# Patient Record
Sex: Female | Born: 1965
Health system: Southern US, Community
[De-identification: ages and names within clinical notes are randomized; demographics above are authoritative.]

## PROBLEM LIST (undated history)

## (undated) DIAGNOSIS — K649 Unspecified hemorrhoids: Secondary | ICD-10-CM

## (undated) DIAGNOSIS — M810 Age-related osteoporosis without current pathological fracture: Secondary | ICD-10-CM

## (undated) DIAGNOSIS — Z8679 Personal history of other diseases of the circulatory system: Secondary | ICD-10-CM

## (undated) HISTORY — PX: DILATION AND CURETTAGE OF UTERUS: SHX78

## (undated) HISTORY — PX: COLONOSCOPY: SHX174

## (undated) HISTORY — PX: CHOLECYSTECTOMY: SHX55

## (undated) HISTORY — PX: OTHER SURGICAL HISTORY: SHX169

## (undated) HISTORY — DX: Unspecified hemorrhoids: K64.9

## (undated) HISTORY — DX: Age-related osteoporosis without current pathological fracture: M81.0

---

## 1998-10-04 ENCOUNTER — Other Ambulatory Visit: Admission: RE | Admit: 1998-10-04 | Discharge: 1998-10-04 | Payer: Self-pay | Admitting: Obstetrics and Gynecology

## 1999-10-29 ENCOUNTER — Other Ambulatory Visit: Admission: RE | Admit: 1999-10-29 | Discharge: 1999-10-29 | Payer: Self-pay | Admitting: Obstetrics and Gynecology

## 2000-12-24 ENCOUNTER — Other Ambulatory Visit: Admission: RE | Admit: 2000-12-24 | Discharge: 2000-12-24 | Payer: Self-pay | Admitting: Obstetrics and Gynecology

## 2002-01-19 ENCOUNTER — Other Ambulatory Visit: Admission: RE | Admit: 2002-01-19 | Discharge: 2002-01-19 | Payer: Self-pay | Admitting: Obstetrics and Gynecology

## 2003-01-22 ENCOUNTER — Other Ambulatory Visit: Admission: RE | Admit: 2003-01-22 | Discharge: 2003-01-22 | Payer: Self-pay | Admitting: Obstetrics and Gynecology

## 2004-02-04 ENCOUNTER — Other Ambulatory Visit: Admission: RE | Admit: 2004-02-04 | Discharge: 2004-02-04 | Payer: Self-pay | Admitting: Obstetrics and Gynecology

## 2004-08-07 ENCOUNTER — Other Ambulatory Visit: Admission: RE | Admit: 2004-08-07 | Discharge: 2004-08-07 | Payer: Self-pay | Admitting: Obstetrics and Gynecology

## 2005-02-11 ENCOUNTER — Other Ambulatory Visit: Admission: RE | Admit: 2005-02-11 | Discharge: 2005-02-11 | Payer: Self-pay | Admitting: Obstetrics and Gynecology

## 2006-09-08 ENCOUNTER — Encounter: Admission: RE | Admit: 2006-09-08 | Discharge: 2006-09-08 | Payer: Self-pay | Admitting: Obstetrics and Gynecology

## 2007-10-04 ENCOUNTER — Encounter: Admission: RE | Admit: 2007-10-04 | Discharge: 2007-10-04 | Payer: Self-pay | Admitting: Obstetrics and Gynecology

## 2008-11-15 ENCOUNTER — Encounter: Admission: RE | Admit: 2008-11-15 | Discharge: 2008-11-15 | Payer: Self-pay | Admitting: Obstetrics and Gynecology

## 2009-10-26 HISTORY — PX: CHOLECYSTECTOMY: SHX55

## 2012-11-14 ENCOUNTER — Telehealth: Payer: Self-pay | Admitting: Internal Medicine

## 2012-11-14 NOTE — Telephone Encounter (Signed)
Received 2 pages from [facility], sent to Dr. Marina Goodell. 11/14/12/sd

## 2012-11-16 ENCOUNTER — Telehealth: Payer: Self-pay | Admitting: Internal Medicine

## 2012-11-16 NOTE — Telephone Encounter (Signed)
Forward 2 pages form [facility] Dr. Yancey Flemings for review on 11-16-12 ym

## 2014-10-15 ENCOUNTER — Telehealth: Payer: Self-pay | Admitting: *Deleted

## 2014-10-15 NOTE — Telephone Encounter (Signed)
Pt would love for you to call her. She's interested in your class...thanks

## 2015-03-18 NOTE — H&P (Signed)
Cindy Blanchard  DICTATION # [id number] CSN# [id number]   Meriel PicaHOLLAND,Milani Lowenstein M, MD 03/18/2015 2:05 PM

## 2015-03-19 NOTE — H&P (Signed)
NAMParke Simmers:  Cindy Blanchard, Cindy Blanchard              ACCOUNT NO.:  [id number]  MEDICAL RECORD NO.:  [id number]  LOCATION:  [facility]IO                         FACILITY:  [facility]  PHYSICIAN:  Duke Salviaichard M. Marcelle OverlieHolland, M.D.DATE OF BIRTH:  01-09-1966  DATE OF ADMISSION:  01/11/2015 DATE OF DISCHARGE:                             HISTORY & PHYSICAL   CHIEF COMPLAINT:  Stress urinary incontinence, symptomatic uterine prolapse.  HISTORY OF PRESENT ILLNESS:  A 49 year old, G2, P2.  Her husband has had a vasectomy.  She has been bothered for several years with increased problems related to pelvic pressure with uterine prolapse, cystocele, rectocele and complaints of stress urinary incontinence.  Recently underwent urodynamic studies on our office that showed a normal LPP and MUCP.  She did leak with a full bladder especially with standing and tends to have more leaking with sneezing, laughing and exercise.  She presents at this time for LAVH, bilateral salpingectomy, A and P repair along with midurethral sling and cystoscopy.  Also sacrospinous ligament fixation to try and deter future cuff prolapse.  This procedure including specific risk related to bleeding, infection, transfusion, adjacent organ injury, the possible need to complete the surgery by open technique, all discussed along with her expected recovery time.  Issues related to concerns about polypropylene mesh in regard to midurethral sling discussed.  The risk of infection or mesh exposure and how to treat this have been reviewed with her.  PAST MEDICAL HISTORY:  ALLERGIES:  None.  CURRENT MEDICATIONS:  None.  PAST SURGICAL HISTORY:  She has had two prior vaginal deliveries, cholecystectomy in 2011.  REVIEW OF SYSTEMS:  Otherwise unremarkable except for pelvic pain, menorrhagia and prolapse symptoms.  SOCIAL HISTORY:  Denies tobacco or drug use.  She does drink alcohol socially.  She is married.  PHYSICAL EXAMINATION:  VITAL SIGNS:  Temperature 98.2,  blood pressure 110/78. HEENT:  Unremarkable. NECK:  Supple without masses. LUNGS:  Clear. CARDIOVASCULAR:  Regular rate and rhythm without murmurs, rubs, or gallops. BREASTS:  Without masses. ABDOMEN:  Soft, flat, nontender.  Vulva, vagina and cervix revealed moderate uterine prolapse with moderate cystocele and rectocele. Bimanual otherwise negative. EXTREMITIES:  Unremarkable. NEUROLOGIC:  Unremarkable.  IMPRESSION: 1. Stress urinary incontinence. 2. History of menorrhagia, possible adenomyosis on recent ultrasound. 3. Symptomatic uterine prolapse, cystocele and rectocele.  PLAN:  LAVH, bilateral salpingectomy, conservation of ovaries if normal, anterior and posterior colporrhaphy, midurethral sling with cystoscopy, sacrospinous ligament fixation for cuff support.  Procedure and risks were discussed as above.     Roddy Bellamy M. Marcelle OverlieHolland, M.D.     [facility]/MEDQ  D:  03/18/2015  T:  03/19/2015  Job:  [id number]

## 2015-03-20 ENCOUNTER — Encounter (HOSPITAL_COMMUNITY): Payer: Self-pay

## 2015-03-20 ENCOUNTER — Encounter (HOSPITAL_COMMUNITY)
Admission: RE | Admit: 2015-03-20 | Discharge: 2015-03-20 | Disposition: A | Discharge status: Home / Self Care | Payer: BLUE CROSS/BLUE SHIELD | Source: Ambulatory Visit | Attending: Obstetrics and Gynecology | Admitting: Obstetrics and Gynecology

## 2015-03-20 DIAGNOSIS — Z01818 Encounter for other preprocedural examination: Secondary | ICD-10-CM | POA: Insufficient documentation

## 2015-03-20 HISTORY — DX: Personal history of other diseases of the circulatory system: Z86.79

## 2015-03-20 LAB — TYPE AND SCREEN
ABO/RH(D): O POS
Antibody Screen: NEGATIVE

## 2015-03-20 LAB — CBC
HEMATOCRIT: 35.3 % — AB (ref 36.0–46.0)
HEMOGLOBIN: 11.6 g/dL — AB (ref 12.0–15.0)
MCH: 28.5 pg (ref 26.0–34.0)
MCHC: 32.9 g/dL (ref 30.0–36.0)
MCV: 86.7 fL (ref 78.0–100.0)
PLATELETS: 253 10*3/uL (ref 150–400)
RBC: 4.07 MIL/uL (ref 3.87–5.11)
RDW: 13.6 % (ref 11.5–15.5)
WBC: 6.2 10*3/uL (ref 4.0–10.5)

## 2015-03-20 LAB — ABO/RH: ABO/RH(D): O POS

## 2015-03-20 NOTE — Patient Instructions (Addendum)
Your procedure is scheduled on:  June 2,2016  Enter through the Main [facility]trance of [facility] at:  0600  Pick up the phone at the desk and dial [phone].  Call this number if you have problems the morning of surgery: [phone].  Remember: Do NOT eat food:  After midnight  Do NOT drink clear liquids after:  Midnight  Take these medicines the morning of surgery with a SIP OF WATER:  None   Do NOT wear jewelry (body piercing), metal hair clips/bobby pins, make-up, or nail polish.  Do NOT wear lotions, powders, or perfumes.  You may wear deoderant. Do NOT shave for 48 hours prior to surgery. Do NOT bring valuables to the hospital. Contacts, dentures, or bridgework may not be worn into surgery. Leave suitcase in car.  After surgery it may be brought to your room.  For patients admitted to the hospital, checkout time is 11:00 AM the day of discharge.

## 2015-03-27 MED ORDER — DEXTROSE 5 % IV SOLN
2.0000 g | INTRAVENOUS | Status: AC
  Administered 2015-03-28: 2 g via INTRAVENOUS
  Filled 2015-03-27: qty 2

## 2015-03-27 NOTE — Anesthesia Preprocedure Evaluation (Addendum)
Anesthesia Evaluation  Patient identified by MRN, date of birth, ID band Patient awake    Reviewed: Allergy & Precautions, NPO status , Patient's Chart, lab work & pertinent test results  History of Anesthesia Complications Negative for: history of anesthetic complications  Airway Mallampati: II  TM Distance: >3 FB Neck ROM: Full    Dental no notable dental hx. (+) Dental Advisory Given   Pulmonary neg pulmonary ROS,  breath sounds clear to auscultation  Pulmonary exam normal       Cardiovascular Exercise Tolerance: Good Normal cardiovascular exam+ dysrhythmias Supra Ventricular Tachycardia Rhythm:Regular Rate:Normal     Neuro/Psych negative neurological ROS  negative psych ROS   GI/Hepatic negative GI ROS, Neg liver ROS,   Endo/Other  negative endocrine ROS  Renal/GU negative Renal ROS  negative genitourinary   Musculoskeletal negative musculoskeletal ROS (+)   Abdominal   Peds negative pediatric ROS (+)  Hematology negative hematology ROS (+)   Anesthesia Other Findings   Reproductive/Obstetrics negative OB ROS                             Anesthesia Physical Anesthesia Plan  ASA: II  Anesthesia Plan: General   Post-op Pain Management:    Induction: Intravenous  Airway Management Planned: Oral ETT  Additional Equipment:   Intra-op Plan:   Post-operative Plan: Extubation in OR  Informed Consent: I have reviewed the patients History and Physical, chart, labs and discussed the procedure including the risks, benefits and alternatives for the proposed anesthesia with the patient or authorized representative who has indicated his/her understanding and acceptance.   Dental advisory given  Plan Discussed with: CRNA  Anesthesia Plan Comments:         Anesthesia Quick Evaluation

## 2015-03-28 ENCOUNTER — Ambulatory Visit (HOSPITAL_COMMUNITY): Payer: BLUE CROSS/BLUE SHIELD | Admitting: Anesthesiology

## 2015-03-28 ENCOUNTER — Encounter (HOSPITAL_COMMUNITY): Payer: Self-pay | Admitting: Anesthesiology

## 2015-03-28 ENCOUNTER — Encounter (HOSPITAL_COMMUNITY)
Admission: RE | Disposition: A | Discharge status: Home / Self Care | Payer: Self-pay | Source: Ambulatory Visit | Attending: Obstetrics and Gynecology

## 2015-03-28 ENCOUNTER — Inpatient Hospital Stay (HOSPITAL_COMMUNITY)
Admission: RE | Admit: 2015-03-28 | Discharge: 2015-03-29 | DRG: 743 | Disposition: A | Discharge status: Home / Self Care | Payer: BLUE CROSS/BLUE SHIELD | Source: Ambulatory Visit | Attending: Obstetrics and Gynecology | Admitting: Obstetrics and Gynecology

## 2015-03-28 DIAGNOSIS — N8003 Adenomyosis of the uterus: Secondary | ICD-10-CM | POA: Diagnosis present

## 2015-03-28 DIAGNOSIS — Z9049 Acquired absence of other specified parts of digestive tract: Secondary | ICD-10-CM | POA: Diagnosis present

## 2015-03-28 DIAGNOSIS — N920 Excessive and frequent menstruation with regular cycle: Secondary | ICD-10-CM | POA: Diagnosis present

## 2015-03-28 DIAGNOSIS — N814 Uterovaginal prolapse, unspecified: Secondary | ICD-10-CM | POA: Diagnosis present

## 2015-03-28 DIAGNOSIS — N393 Stress incontinence (female) (male): Secondary | ICD-10-CM | POA: Diagnosis present

## 2015-03-28 DIAGNOSIS — N800 Endometriosis of uterus: Secondary | ICD-10-CM | POA: Diagnosis present

## 2015-03-28 DIAGNOSIS — N809 Endometriosis, unspecified: Secondary | ICD-10-CM

## 2015-03-28 HISTORY — PX: ANTERIOR AND POSTERIOR REPAIR WITH SACROSPINOUS FIXATION: SHX6536

## 2015-03-28 HISTORY — PX: PUBOVAGINAL SLING: SHX1035

## 2015-03-28 HISTORY — PX: BILATERAL SALPINGECTOMY: SHX5743

## 2015-03-28 HISTORY — PX: LAPAROSCOPIC ASSISTED VAGINAL HYSTERECTOMY: SHX5398

## 2015-03-28 SURGERY — HYSTERECTOMY, VAGINAL, LAPAROSCOPY-ASSISTED
Anesthesia: General

## 2015-03-28 MED ORDER — ESTRADIOL 0.1 MG/GM VA CREA
TOPICAL_CREAM | VAGINAL | Status: AC
  Filled 2015-03-28: qty 42.5

## 2015-03-28 MED ORDER — KETOROLAC TROMETHAMINE 30 MG/ML IJ SOLN
INTRAMUSCULAR | Status: DC | PRN
  Administered 2015-03-28: 30 mg via INTRAVENOUS

## 2015-03-28 MED ORDER — EPHEDRINE SULFATE 50 MG/ML IJ SOLN
INTRAMUSCULAR | Status: DC | PRN
  Administered 2015-03-28: 15 mg via INTRAVENOUS
  Administered 2015-03-28 (×2): 5 mg via INTRAVENOUS
  Administered 2015-03-28: 15 mg via INTRAVENOUS

## 2015-03-28 MED ORDER — DEXAMETHASONE SODIUM PHOSPHATE 10 MG/ML IJ SOLN
INTRAMUSCULAR | Status: DC | PRN
  Administered 2015-03-28: 4 mg via INTRAVENOUS

## 2015-03-28 MED ORDER — FLUORESCEIN SODIUM 10 % IJ SOLN
INTRAMUSCULAR | Status: DC | PRN
  Administered 2015-03-28: 100 mg via INTRAVENOUS

## 2015-03-28 MED ORDER — STERILE WATER FOR IRRIGATION IR SOLN
Status: DC | PRN
  Administered 2015-03-28: 1000 mL via INTRAVESICAL

## 2015-03-28 MED ORDER — SODIUM CHLORIDE 0.9 % IJ SOLN: 9.0000 mL | INTRAMUSCULAR | Status: DC | PRN

## 2015-03-28 MED ORDER — NALOXONE HCL 0.4 MG/ML IJ SOLN: 0.4000 mg | INTRAMUSCULAR | Status: DC | PRN

## 2015-03-28 MED ORDER — 0.9 % SODIUM CHLORIDE (POUR BTL) OPTIME
TOPICAL | Status: DC | PRN
  Administered 2015-03-28: 1000 mL

## 2015-03-28 MED ORDER — FENTANYL CITRATE (PF) 250 MCG/5ML IJ SOLN
INTRAMUSCULAR | Status: AC
  Filled 2015-03-28: qty 5

## 2015-03-28 MED ORDER — SCOPOLAMINE 1 MG/3DAYS TD PT72
1.0000 | MEDICATED_PATCH | Freq: Once | TRANSDERMAL | Status: DC
  Administered 2015-03-28: 1.5 mg via TRANSDERMAL

## 2015-03-28 MED ORDER — MORPHINE SULFATE (PF) 1 MG/ML IV SOLN
INTRAVENOUS | Status: DC
  Administered 2015-03-28: 2 mg via INTRAVENOUS
  Administered 2015-03-28: 11:00:00 via INTRAVENOUS
  Filled 2015-03-28: qty 25

## 2015-03-28 MED ORDER — ESTRADIOL 0.1 MG/GM VA CREA
TOPICAL_CREAM | VAGINAL | Status: DC | PRN
  Administered 2015-03-28: 1 via VAGINAL

## 2015-03-28 MED ORDER — ONDANSETRON HCL 4 MG/2ML IJ SOLN
INTRAMUSCULAR | Status: DC | PRN
  Administered 2015-03-28: 4 mg via INTRAVENOUS

## 2015-03-28 MED ORDER — NEOSTIGMINE METHYLSULFATE 10 MG/10ML IV SOLN
INTRAVENOUS | Status: AC
  Filled 2015-03-28: qty 1

## 2015-03-28 MED ORDER — KETOROLAC TROMETHAMINE 30 MG/ML IJ SOLN
30.0000 mg | Freq: Once | INTRAMUSCULAR | Status: DC
Start: 2015-03-28 — End: 2015-03-28

## 2015-03-28 MED ORDER — FLUORESCEIN SODIUM 10 % IJ SOLN
INTRAMUSCULAR | Status: AC
  Filled 2015-03-28: qty 5

## 2015-03-28 MED ORDER — LIDOCAINE HCL (CARDIAC) 20 MG/ML IV SOLN
INTRAVENOUS | Status: AC
  Filled 2015-03-28: qty 5

## 2015-03-28 MED ORDER — ONDANSETRON HCL 4 MG/2ML IJ SOLN
INTRAMUSCULAR | Status: AC
  Filled 2015-03-28: qty 2

## 2015-03-28 MED ORDER — ONDANSETRON HCL 4 MG PO TABS: 4.0000 mg | ORAL_TABLET | Freq: Four times a day (QID) | ORAL | Status: DC | PRN

## 2015-03-28 MED ORDER — FLUMAZENIL 0.5 MG/5ML IV SOLN
INTRAVENOUS | Status: DC | PRN
  Administered 2015-03-28: 0.2 mg via INTRAVENOUS

## 2015-03-28 MED ORDER — LIDOCAINE-EPINEPHRINE 1 %-1:100000 IJ SOLN
INTRAMUSCULAR | Status: DC | PRN
  Administered 2015-03-28: 7 mL

## 2015-03-28 MED ORDER — ARTIFICIAL TEARS OP OINT
TOPICAL_OINTMENT | OPHTHALMIC | Status: AC
  Filled 2015-03-28: qty 3.5

## 2015-03-28 MED ORDER — DIPHENHYDRAMINE HCL 12.5 MG/5ML PO ELIX: 12.5000 mg | ORAL_SOLUTION | Freq: Four times a day (QID) | ORAL | Status: DC | PRN

## 2015-03-28 MED ORDER — MIDAZOLAM HCL 2 MG/2ML IJ SOLN
INTRAMUSCULAR | Status: AC
  Filled 2015-03-28: qty 2

## 2015-03-28 MED ORDER — FENTANYL CITRATE (PF) 100 MCG/2ML IJ SOLN
INTRAMUSCULAR | Status: AC
Start: 2015-03-28 — End: 2015-03-28
  Filled 2015-03-28: qty 2

## 2015-03-28 MED ORDER — ONDANSETRON HCL 4 MG/2ML IJ SOLN: 4.0000 mg | Freq: Four times a day (QID) | INTRAMUSCULAR | Status: DC | PRN

## 2015-03-28 MED ORDER — EPHEDRINE 5 MG/ML INJ
INTRAVENOUS | Status: AC
  Filled 2015-03-28: qty 10

## 2015-03-28 MED ORDER — GLYCOPYRROLATE 0.2 MG/ML IJ SOLN
INTRAMUSCULAR | Status: DC | PRN
  Administered 2015-03-28: 0.6 mg via INTRAVENOUS

## 2015-03-28 MED ORDER — METHYLENE BLUE 1 % INJ SOLN
INTRAMUSCULAR | Status: AC
  Filled 2015-03-28: qty 1

## 2015-03-28 MED ORDER — KETOROLAC TROMETHAMINE 30 MG/ML IJ SOLN: 30.0000 mg | Freq: Four times a day (QID) | INTRAMUSCULAR | Status: DC

## 2015-03-28 MED ORDER — FENTANYL CITRATE (PF) 100 MCG/2ML IJ SOLN
25.0000 ug | INTRAMUSCULAR | Status: DC | PRN
  Administered 2015-03-28 (×2): 50 ug via INTRAVENOUS

## 2015-03-28 MED ORDER — GLYCOPYRROLATE 0.2 MG/ML IJ SOLN
INTRAMUSCULAR | Status: AC
  Filled 2015-03-28: qty 3

## 2015-03-28 MED ORDER — OXYCODONE-ACETAMINOPHEN 5-325 MG PO TABS: 1.0000 | ORAL_TABLET | ORAL | Status: DC | PRN

## 2015-03-28 MED ORDER — ROCURONIUM BROMIDE 100 MG/10ML IV SOLN
INTRAVENOUS | Status: AC
  Filled 2015-03-28: qty 1

## 2015-03-28 MED ORDER — MENTHOL 3 MG MT LOZG: 1.0000 | LOZENGE | OROMUCOSAL | Status: DC | PRN

## 2015-03-28 MED ORDER — BUPIVACAINE HCL (PF) 0.25 % IJ SOLN
INTRAMUSCULAR | Status: AC
Start: 2015-03-28 — End: 2015-03-28
  Filled 2015-03-28: qty 30

## 2015-03-28 MED ORDER — FENTANYL CITRATE (PF) 100 MCG/2ML IJ SOLN
INTRAMUSCULAR | Status: DC | PRN
  Administered 2015-03-28 (×5): 50 ug via INTRAVENOUS

## 2015-03-28 MED ORDER — ACETAMINOPHEN 10 MG/ML IV SOLN
1000.0000 mg | Freq: Once | INTRAVENOUS | Status: AC
  Administered 2015-03-28: 1000 mg via INTRAVENOUS
  Filled 2015-03-28: qty 100

## 2015-03-28 MED ORDER — ROCURONIUM BROMIDE 100 MG/10ML IV SOLN
INTRAVENOUS | Status: DC | PRN
  Administered 2015-03-28: 50 mg via INTRAVENOUS

## 2015-03-28 MED ORDER — SCOPOLAMINE 1 MG/3DAYS TD PT72
MEDICATED_PATCH | TRANSDERMAL | Status: DC
Start: 2015-03-28 — End: 2015-03-29
  Administered 2015-03-28: 1.5 mg via TRANSDERMAL
  Filled 2015-03-28: qty 1

## 2015-03-28 MED ORDER — KETOROLAC TROMETHAMINE 30 MG/ML IJ SOLN
INTRAMUSCULAR | Status: AC
  Filled 2015-03-28: qty 1

## 2015-03-28 MED ORDER — NEOSTIGMINE METHYLSULFATE 10 MG/10ML IV SOLN
INTRAVENOUS | Status: DC | PRN
  Administered 2015-03-28: 4 mg via INTRAVENOUS

## 2015-03-28 MED ORDER — LACTATED RINGERS IV SOLN
INTRAVENOUS | Status: DC
  Administered 2015-03-28 (×3): via INTRAVENOUS

## 2015-03-28 MED ORDER — LIDOCAINE-EPINEPHRINE (PF) 2 %-1:200000 IJ SOLN
INTRAMUSCULAR | Status: AC
  Filled 2015-03-28: qty 20

## 2015-03-28 MED ORDER — BUTORPHANOL TARTRATE 1 MG/ML IJ SOLN: 1.0000 mg | INTRAMUSCULAR | Status: DC | PRN

## 2015-03-28 MED ORDER — KETOROLAC TROMETHAMINE 30 MG/ML IJ SOLN
30.0000 mg | Freq: Four times a day (QID) | INTRAMUSCULAR | Status: DC
  Administered 2015-03-28: 30 mg via INTRAVENOUS
  Filled 2015-03-28 (×2): qty 1

## 2015-03-28 MED ORDER — LIDOCAINE HCL (CARDIAC) 20 MG/ML IV SOLN
INTRAVENOUS | Status: DC | PRN
  Administered 2015-03-28: 30 mg via INTRAVENOUS
  Administered 2015-03-28: 70 mg via INTRAVENOUS

## 2015-03-28 MED ORDER — LIDOCAINE-EPINEPHRINE 1 %-1:100000 IJ SOLN
INTRAMUSCULAR | Status: AC
Start: 2015-03-28 — End: 2015-03-28
  Filled 2015-03-28: qty 1

## 2015-03-28 MED ORDER — PROPOFOL 10 MG/ML IV BOLUS
INTRAVENOUS | Status: DC | PRN
  Administered 2015-03-28: 160 mg via INTRAVENOUS

## 2015-03-28 MED ORDER — PROPOFOL 10 MG/ML IV BOLUS
INTRAVENOUS | Status: AC
  Filled 2015-03-28: qty 20

## 2015-03-28 MED ORDER — DIPHENHYDRAMINE HCL 50 MG/ML IJ SOLN: 12.5000 mg | Freq: Four times a day (QID) | INTRAMUSCULAR | Status: DC | PRN

## 2015-03-28 MED ORDER — DEXTROSE IN LACTATED RINGERS 5 % IV SOLN
INTRAVENOUS | Status: DC
  Administered 2015-03-28: 17:00:00 via INTRAVENOUS

## 2015-03-28 MED ORDER — ONDANSETRON HCL 4 MG/2ML IJ SOLN: 4.0000 mg | Freq: Once | INTRAMUSCULAR | Status: DC | PRN

## 2015-03-28 MED ORDER — DEXAMETHASONE SODIUM PHOSPHATE 4 MG/ML IJ SOLN
INTRAMUSCULAR | Status: AC
  Filled 2015-03-28: qty 1

## 2015-03-28 MED ORDER — FLUMAZENIL 0.5 MG/5ML IV SOLN
INTRAVENOUS | Status: AC
  Filled 2015-03-28: qty 5

## 2015-03-28 MED ORDER — MIDAZOLAM HCL 2 MG/2ML IJ SOLN
INTRAMUSCULAR | Status: DC | PRN
  Administered 2015-03-28: 2 mg via INTRAVENOUS

## 2015-03-28 MED ORDER — IBUPROFEN 800 MG PO TABS
800.0000 mg | ORAL_TABLET | Freq: Three times a day (TID) | ORAL | Status: DC | PRN
  Administered 2015-03-29: 800 mg via ORAL
  Filled 2015-03-28: qty 1

## 2015-03-28 MED ORDER — BUPIVACAINE HCL (PF) 0.25 % IJ SOLN
INTRAMUSCULAR | Status: DC | PRN
  Administered 2015-03-28: 12 mL

## 2015-03-28 SURGICAL SUPPLY — 64 items
BLADE SURG 11 STRL SS (BLADE) ×4 IMPLANT
BLADE SURG 15 STRL LF C SS BP (BLADE) IMPLANT
BLADE SURG 15 STRL SS (BLADE)
BNDG GAUZE ELAST 4 BULKY (GAUZE/BANDAGES/DRESSINGS) IMPLANT
CABLE HIGH FREQUENCY MONO STRZ (ELECTRODE) IMPLANT
CANISTER SUCT 3000ML (MISCELLANEOUS) ×4 IMPLANT
CATH ROBINSON RED A/P 16FR (CATHETERS) ×4 IMPLANT
CLOTH BEACON ORANGE TIMEOUT ST (SAFETY) ×4 IMPLANT
CONT PATH 16OZ SNAP LID 3702 (MISCELLANEOUS) ×4 IMPLANT
COVER BACK TABLE 60X90IN (DRAPES) ×4 IMPLANT
DECANTER SPIKE VIAL GLASS SM (MISCELLANEOUS) ×8 IMPLANT
DEVICE CAPIO SLIM SINGLE (INSTRUMENTS) IMPLANT
DRSG COVADERM PLUS 2X2 (GAUZE/BANDAGES/DRESSINGS) ×8 IMPLANT
DRSG OPSITE POSTOP 3X4 (GAUZE/BANDAGES/DRESSINGS) ×4 IMPLANT
DURAPREP 26ML APPLICATOR (WOUND CARE) ×4 IMPLANT
ELECT LIGASURE SHORT 9 REUSE (ELECTRODE) ×4 IMPLANT
ELECT REM PT RETURN 9FT ADLT (ELECTROSURGICAL) ×4
ELECTRODE REM PT RTRN 9FT ADLT (ELECTROSURGICAL) ×2 IMPLANT
GAUZE PACKING 1 X5 YD ST (GAUZE/BANDAGES/DRESSINGS) IMPLANT
GAUZE SPONGE 4X4 16PLY XRAY LF (GAUZE/BANDAGES/DRESSINGS) ×4 IMPLANT
GLOVE BIO SURGEON STRL SZ7 (GLOVE) ×8 IMPLANT
GLOVE BIOGEL PI IND STRL 6.5 (GLOVE) ×2 IMPLANT
GLOVE BIOGEL PI IND STRL 7.0 (GLOVE) ×4 IMPLANT
GLOVE BIOGEL PI INDICATOR 6.5 (GLOVE) ×2
GLOVE BIOGEL PI INDICATOR 7.0 (GLOVE) ×4
GOWN STRL REUS W/TWL LRG LVL3 (GOWN DISPOSABLE) ×16 IMPLANT
LIQUID BAND (GAUZE/BANDAGES/DRESSINGS) ×4 IMPLANT
NEEDLE HYPO 22GX1.5 SAFETY (NEEDLE) ×4 IMPLANT
NEEDLE INSUFFLATION 120MM (ENDOMECHANICALS) ×4 IMPLANT
NEEDLE MAYO 6 CRC TAPER PT (NEEDLE) IMPLANT
NS IRRIG 1000ML POUR BTL (IV SOLUTION) ×4 IMPLANT
PACK LAVH (CUSTOM PROCEDURE TRAY) ×4 IMPLANT
PACK ROBOTIC GOWN (GOWN DISPOSABLE) ×4 IMPLANT
PACK VAGINAL WOMENS (CUSTOM PROCEDURE TRAY) ×4 IMPLANT
PAD POSITIONER PINK NONSTERILE (MISCELLANEOUS) ×4 IMPLANT
PLUG CATH AND CAP STER (CATHETERS) IMPLANT
SEALER TISSUE G2 CVD JAW 45CM (ENDOMECHANICALS) ×4 IMPLANT
SET CYSTO W/LG BORE CLAMP LF (SET/KITS/TRAYS/PACK) ×4 IMPLANT
SET IRRIG TUBING LAPAROSCOPIC (IRRIGATION / IRRIGATOR) IMPLANT
SLING SOLYX SYSTEM SIS EA (Sling) ×4 IMPLANT
SUT CAPIO POLYGLYCOLIC (SUTURE) IMPLANT
SUT MON AB 2-0 CT1 36 (SUTURE) ×12 IMPLANT
SUT MON AB 3-0 SH 27 (SUTURE)
SUT MON AB 3-0 SH27 (SUTURE) IMPLANT
SUT VIC AB 0 CT1 18XCR BRD8 (SUTURE) ×4 IMPLANT
SUT VIC AB 0 CT1 36 (SUTURE) ×4 IMPLANT
SUT VIC AB 0 CT1 8-18 (SUTURE) ×4
SUT VIC AB 2-0 CT1 27 (SUTURE)
SUT VIC AB 2-0 CT1 TAPERPNT 27 (SUTURE) IMPLANT
SUT VIC AB 2-0 CT2 27 (SUTURE) ×12 IMPLANT
SUT VIC AB 2-0 SH 27 (SUTURE)
SUT VIC AB 2-0 SH 27XBRD (SUTURE) IMPLANT
SUT VIC AB 2-0 UR6 27 (SUTURE) ×16 IMPLANT
SUT VICRYL 0 TIES 12 18 (SUTURE) ×4 IMPLANT
SUT VICRYL 4-0 PS2 18IN ABS (SUTURE) ×4 IMPLANT
SUT VICRYL RAPIDE 3 0 (SUTURE) ×4 IMPLANT
SUT VICRYL RAPIDE 3-0 36IN (SUTURE) ×4 IMPLANT
SYR CONTROL 10ML LL (SYRINGE) ×4 IMPLANT
TOWEL OR 17X24 6PK STRL BLUE (TOWEL DISPOSABLE) ×8 IMPLANT
TRAY FOLEY CATH SILVER 14FR (SET/KITS/TRAYS/PACK) ×4 IMPLANT
TROCAR OPTI TIP 5M 100M (ENDOMECHANICALS) ×4 IMPLANT
TROCAR XCEL DIL TIP R 11M (ENDOMECHANICALS) ×4 IMPLANT
WARMER LAPAROSCOPE (MISCELLANEOUS) ×4 IMPLANT
WATER STERILE IRR 1000ML POUR (IV SOLUTION) ×4 IMPLANT

## 2015-03-28 NOTE — Transfer of Care (Signed)
Immediate Anesthesia Transfer of Care Note  Patient: Cindy Blanchard  Procedure(s) Performed: Procedure(s): LAPAROSCOPIC ASSISTED VAGINAL HYSTERECTOMY (N/A) BILATERAL SALPINGECTOMY (Bilateral) ANTERIOR AND POSTERIOR REPAIR  (N/A) SOLYX SINGLE INCISION SLING WITH CYSTO (N/A)  Patient Location: PACU  Anesthesia Type:General  Level of Consciousness: awake, alert , oriented and patient cooperative  Airway & Oxygen Therapy: Patient Spontanous Breathing and Patient connected to nasal cannula oxygen  Post-op Assessment: Report given to RN and Post -op Vital signs reviewed and stable  Post vital signs: Reviewed and stable  Last Vitals:  Filed Vitals:   03/28/15 0606  Pulse: 83  Temp: 36.7 C  Resp: 20    Complications: No apparent anesthesia complications

## 2015-03-28 NOTE — Op Note (Signed)
Preoperative diagnosis: Menorrhagia pelvic pain, uterine prolapse, cystocele, rectocele, stress urinary incontinence  Postoperative diagnosis: Same, probable adenomyosis  Procedure: LAVH, bilateral salpingectomy, anterior and posterior repair, McCall's culdoplasty, Solix midureteral sling with cystoscopy.  Surgeon: Marcelle Overlie  Assistant: Morris  EBL: 300 cc  Specimens removed: Uterus, bilateral tubes, vaginal mucosa, all to pathology.  Procedure and findings:  The patient was taken the operating room after an adequate level of general anesthesia was obtained with the patient's legs in stirrups the abdomen perineum and vagina were prepped and draped in the usual fashion. Appropriate timeouts were taken at that point. The bladder was drained EUA carried out the uterus was upper limit of normal size, mobile, adnexa negative. Hulka tenaculum was positioned. Attention directed to the abdomen where the subumbilical area was infiltrated with quarter percent Marcaine plain, small incision was made in the varies needle was introduced that difficulty. Its intra-abdominal position was verified by pressure water testing. After 2-1/2 L pneumoperitoneum syncopated, lap scopic trocar and sleeve were then introduced without difficulty. There was no evidence of any bleeding or trauma. 3 finger breaths above the symphysis in the midline, a 5 mm trocar was inserted. The patient was then placed in Trendelenburg and the pelvic findings as follows:  Anterior posterior cul-de-sac spaces were free and clear the uterus was 6-7 weeks size, globular at the fundus consistent with probable adenomyosis. Bilateral tubes and ovaries and upper abdomen normal atraumatic grasper was then used to grasp the right tube which is placed on traction the Enseal device was used to coagulate and divide the mesosalpinx completely dividing the tube, this was done on each side, both ovaries were conserved. The Enseal was then used to coagulate and  divide the utero-ovarian pedicle down to and and including the round ligament. These areas were hemostatic the vaginal portion the procedure was started at that point.  Weighted speculum was positioned cervix grasped with tenaculum cervical vaginal mucosa was incised circumferentially the bladder was advanced superiorly with sharp and blunt dissection until the peritoneum could be identified this was entered sharply retractor was then used to gently elevate the bladder out of the field. Posteriorly culdotomy was performed, in sequential manner the uterosacral ligament cardinal ligament uterine basket or pedicle and upper broad ligament pedicles were clamped divided with the LigaSure device. The fundus of the uterus is in delivered posteriorly remaining pedicles were clamped divided first free tie followed by suture ligature oh Vicryls. The cuff was then closed from 3 to 9:00 with a running locked 20 Vicryls suture. McCall's culdoplasty was performed of between left uterosacral ligament, posterior peritoneum across to the right uterosacral ligament and tied down for extra posterior support. The cuff support was a reasonable. Prior to closure sponge, needle, instrument counts reported as correct 2 vaginal mucosa was then closed with interrupted 2-0 Monocryl sutures. Small cystocele was noted the vaginal mucosa was divided in the midline the perivesical fascia was separated with sharp and blunt dissection, plicated in the midline a small amount of redundant mucosa was excised and closed with interrupted 20 Vicryls sutures reducing the cystocele posteriorly a small triangle of skin was excised at the fourchette an old episiotomy scar could be noted the vaginal Coso was divided in the midline approximately two thirds the way up decision made not to proceed with 6 minus ligament suspension since the cuff support was reasonable judgment. The perirectal fascia was divided with sharp and blunt dissection and the rectocele  was reduced  Fashion plicated in the midline  a small amount of excess vaginal mucosa was trimmed and closed with interrupted 20 Vicryls suture and 3 of Iker repeated on the perineum. A 2 cm vertical incision at the mid urethral area was placed between Allis clamps, Metzenbaum scissors and finger dissection were then used to dissected until the surgical palpated the posterior side of the interpubic ramus on each side easily. The Solix sling was then placed per protocol for starting on the right than at a 45 angle behind the posterior side of the inferior pubic ramus to the halfway point the anchor was released the exact same repeated on the opposite side of the tension was deemed to be appropriate and was released previously she received urine force a mean received a for seen of the in preparation for cystoscopy. Cystoscopy was carried out revealing prompt ureteral jets from each side there was no evidence of urethral or bladder trauma the scope was removed Foley catheter straight drain. The mid urethral mucosal incision was closed with interrupted 20 Vicryls sutures. The vaginal vaginal area was packed with one-inch pack with Estrace cream.  Repeat laparoscopy carried out at that point at reduced pressure revealing the operative sites to be hemostatic instruments removed, gas allowed to escape the upper defect closed with 40 Vicryls subcuticular and Dermabond on the lower she tolerated this well went to recovery room in good condition.  Dictated with [facility]  Rashiya Lofland Milana ObeyM Sheneika Walstad M.D.

## 2015-03-28 NOTE — Anesthesia Postprocedure Evaluation (Signed)
  Anesthesia Post-op Note  Patient: Cindy Blanchard  Procedure(s) Performed: Procedure(s) (LRB): LAPAROSCOPIC ASSISTED VAGINAL HYSTERECTOMY (N/A) BILATERAL SALPINGECTOMY (Bilateral) ANTERIOR AND POSTERIOR REPAIR  (N/A) SOLYX SINGLE INCISION SLING WITH CYSTO (N/A)  Patient Location: PACU  Anesthesia Type: General  Level of Consciousness: awake and alert   Airway and Oxygen Therapy: Patient Spontanous Breathing  Post-op Pain: mild  Post-op Assessment: Post-op Vital signs reviewed, Patient's Cardiovascular Status Stable, Respiratory Function Stable, Patent Airway and No signs of Nausea or vomiting  Last Vitals:  Filed Vitals:   03/28/15 0930  Pulse: 82  Temp: 36.7 C  Resp: 6    Post-op Vital Signs: stable   Complications: No apparent anesthesia complications

## 2015-03-28 NOTE — Progress Notes (Signed)
The patient was re-examined with no change in status 

## 2015-03-28 NOTE — Addendum Note (Signed)
Addendum  created 03/28/15 1623 by Orlie Pollenebra R Jaisa Defino, CRNA   Modules edited: Notes Section   Notes Section:  File: [id number]

## 2015-03-28 NOTE — Anesthesia Postprocedure Evaluation (Signed)
  Anesthesia Post-op Note  Patient: Cindy Blanchard  Procedure(s) Performed: Procedure(s): LAPAROSCOPIC ASSISTED VAGINAL HYSTERECTOMY (N/A) BILATERAL SALPINGECTOMY (Bilateral) ANTERIOR AND POSTERIOR REPAIR  (N/A) SOLYX SINGLE INCISION SLING WITH CYSTO (N/A)  Patient Location: PACU and [facility]  Anesthesia Type:General  Level of Consciousness: awake, alert , oriented and patient cooperative  Airway and Oxygen Therapy: Patient Spontanous Breathing  Post-op Pain: none  Post-op Assessment: Post-op Vital signs reviewed, Patient's Cardiovascular Status Stable, Respiratory Function Stable, Patent Airway, No signs of Nausea or vomiting, Adequate PO intake and Pain level controlled  Post-op Vital Signs: Reviewed and stable  Last Vitals:  Filed Vitals:   03/28/15 1522  BP: 96/51  Pulse: 75  Temp:   Resp:     Complications: No apparent anesthesia complications

## 2015-03-28 NOTE — Anesthesia Procedure Notes (Signed)
Procedure Name: Intubation Date/Time: 03/28/2015 7:23 AM Performed by: Suella GroveMOORE, Jissel Slavens C Pre-anesthesia Checklist: Emergency Drugs available, Patient identified, Timeout performed, Suction available and Patient being monitored Patient Re-evaluated:Patient Re-evaluated prior to inductionOxygen Delivery Method: Circle system utilized and Simple face mask Preoxygenation: Pre-oxygenation with 100% oxygen Intubation Type: IV induction Ventilation: Mask ventilation without difficulty Laryngoscope Size: Mac and 3 Grade View: Grade II Tube type: Oral Tube size: 7.0 mm Number of attempts: 1 Airway Equipment and Method: Stylet Placement Confirmation: ETT inserted through vocal cords under direct vision,  positive ETCO2 and breath sounds checked- equal and bilateral Secured at: 22 cm Tube secured with: Tape Dental Injury: Teeth and Oropharynx as per pre-operative assessment

## 2015-03-29 ENCOUNTER — Encounter (HOSPITAL_COMMUNITY): Payer: Self-pay | Admitting: Obstetrics and Gynecology

## 2015-03-29 LAB — CBC
HEMATOCRIT: 26.2 % — AB (ref 36.0–46.0)
Hemoglobin: 8.7 g/dL — ABNORMAL LOW (ref 12.0–15.0)
MCH: 28.3 pg (ref 26.0–34.0)
MCHC: 33.2 g/dL (ref 30.0–36.0)
MCV: 85.3 fL (ref 78.0–100.0)
Platelets: 144 10*3/uL — ABNORMAL LOW (ref 150–400)
RBC: 3.07 MIL/uL — ABNORMAL LOW (ref 3.87–5.11)
RDW: 13.6 % (ref 11.5–15.5)
WBC: 8.2 10*3/uL (ref 4.0–10.5)

## 2015-03-29 MED ORDER — OXYCODONE-ACETAMINOPHEN 5-325 MG PO TABS: 1.0000 | ORAL_TABLET | ORAL | Status: DC | PRN

## 2015-03-29 MED ORDER — IBUPROFEN 800 MG PO TABS: 800.0000 mg | ORAL_TABLET | Freq: Three times a day (TID) | ORAL | Status: DC | PRN

## 2015-03-29 NOTE — Progress Notes (Signed)
Patient voided 150cc pink tinged colored urine without any difficulty.

## 2015-03-29 NOTE — Discharge Summary (Signed)
Physician Discharge Summary  Patient ID: Cindy Blanchard MRN: [id number] DOB/AGE: Apr 15, 1966 49 y.o.  Admit date: 03/28/2015 Discharge date: 03/29/2015  Admission Diagnoses:pelvic pain, uterine prolapse, cystocele ,rectocele, SUI  Discharge Diagnoses: same + prob adenomyosis Active Problems:   Adenomyosis   Discharged Condition: good  Hospital Course: adm for LAVH, bilat salpingectomy, A and P repair and sling, D/C on POD 1, passes voiding trial, afeb, tol PO and ready for D/C  Consults: None  Significant Diagnostic Studies: labs:  CBC    Component Value Date/Time   WBC 8.2 03/29/2015 0528   RBC 3.07* 03/29/2015 0528   HGB 8.7* 03/29/2015 0528   HCT 26.2* 03/29/2015 0528   PLT 144* 03/29/2015 0528   MCV 85.3 03/29/2015 0528   MCH 28.3 03/29/2015 0528   MCHC 33.2 03/29/2015 0528   RDW 13.6 03/29/2015 0528      Treatments: surgery: LAVH, A+P repair, sling  Discharge Exam: Blood pressure 94/56, pulse 71, temperature 99.2 F (37.3 C), temperature source Oral, resp. rate 20, height 5\' 8"  (1.727 m), weight 129 lb (58.514 kg), SpO2 100 %. General appearance: alert GI: soft, non-tender; bowel sounds normal; no masses,  no organomegaly and incs C/D  Disposition: Final discharge disposition not confirmed     Medication List    STOP taking these medications        CALTRATE 600+D PLUS MINERALS PO      TAKE these medications        ibuprofen 800 MG tablet  Commonly known as:  ADVIL,MOTRIN  Take 1 tablet (800 mg total) by mouth every 8 (eight) hours as needed (mild pain).     multivitamin with minerals Tabs tablet  Take 1 tablet by mouth daily.     oxyCODONE-acetaminophen 5-325 MG per tablet  Commonly known as:  PERCOCET/ROXICET  Take 1-2 tablets by mouth every 4 (four) hours as needed for severe pain (moderate to severe pain (when tolerating fluids)).           Follow-up Information    Follow up with Meriel PicaHOLLAND,Bonnie Overdorf M, MD. Schedule an appointment as soon as  possible for a visit in 1 week.   Specialty:  Obstetrics and Gynecology   Contact information:   [address] [place] [id number] [phone]       Signed: Meriel PicaHOLLAND,Jonhatan Hearty M 03/29/2015, 1:41 PM

## 2015-03-29 NOTE — Progress Notes (Signed)
Pt verbalizes understanding of d/c instructions, medications, follow up appts, when to seek medical care, and belongings policy. IV was previously removed without complications. No questions at this time. I escorted pt to the main entrance where her husband met [place] and is driving her home. Marry Guan

## 2015-03-29 NOTE — Progress Notes (Signed)
Vaginal packing removed as ordered with moderate amount dark bloody drainage noted on packing, patient tolerated well.  200cc sterile water instilled into bladder, foley cath removed as ordered patient tolerated well.

## 2017-02-01 DIAGNOSIS — Z681 Body mass index (BMI) 19 or less, adult: Secondary | ICD-10-CM | POA: Diagnosis not present

## 2017-02-01 DIAGNOSIS — Z01419 Encounter for gynecological examination (general) (routine) without abnormal findings: Secondary | ICD-10-CM | POA: Diagnosis not present

## 2017-02-01 DIAGNOSIS — Z1231 Encounter for screening mammogram for malignant neoplasm of breast: Secondary | ICD-10-CM | POA: Diagnosis not present

## 2017-02-03 ENCOUNTER — Other Ambulatory Visit: Payer: Self-pay | Admitting: Obstetrics and Gynecology

## 2017-02-03 DIAGNOSIS — R928 Other abnormal and inconclusive findings on diagnostic imaging of breast: Secondary | ICD-10-CM

## 2017-02-08 ENCOUNTER — Ambulatory Visit
Admission: RE | Admit: 2017-02-08 | Discharge: 2017-02-08 | Disposition: A | Discharge status: Home / Self Care | Payer: BLUE CROSS/BLUE SHIELD | Source: Ambulatory Visit | Attending: Obstetrics and Gynecology | Admitting: Obstetrics and Gynecology

## 2017-02-08 DIAGNOSIS — R928 Other abnormal and inconclusive findings on diagnostic imaging of breast: Secondary | ICD-10-CM

## 2017-02-08 DIAGNOSIS — R921 Mammographic calcification found on diagnostic imaging of breast: Secondary | ICD-10-CM | POA: Diagnosis not present

## 2017-10-06 IMAGING — MG DIGITAL DIAGNOSTIC UNILATERAL LEFT MAMMOGRAM
3 series · 3 of 3 positions shown · non-contrast
Comparison: 02/01/2017 and earlier

CLINICAL DATA: The patient returns after screening study for
evaluation of possible left breast calcifications.

EXAM:
DIGITAL DIAGNOSTIC LEFT MAMMOGRAM WITH CAD

[L CC]
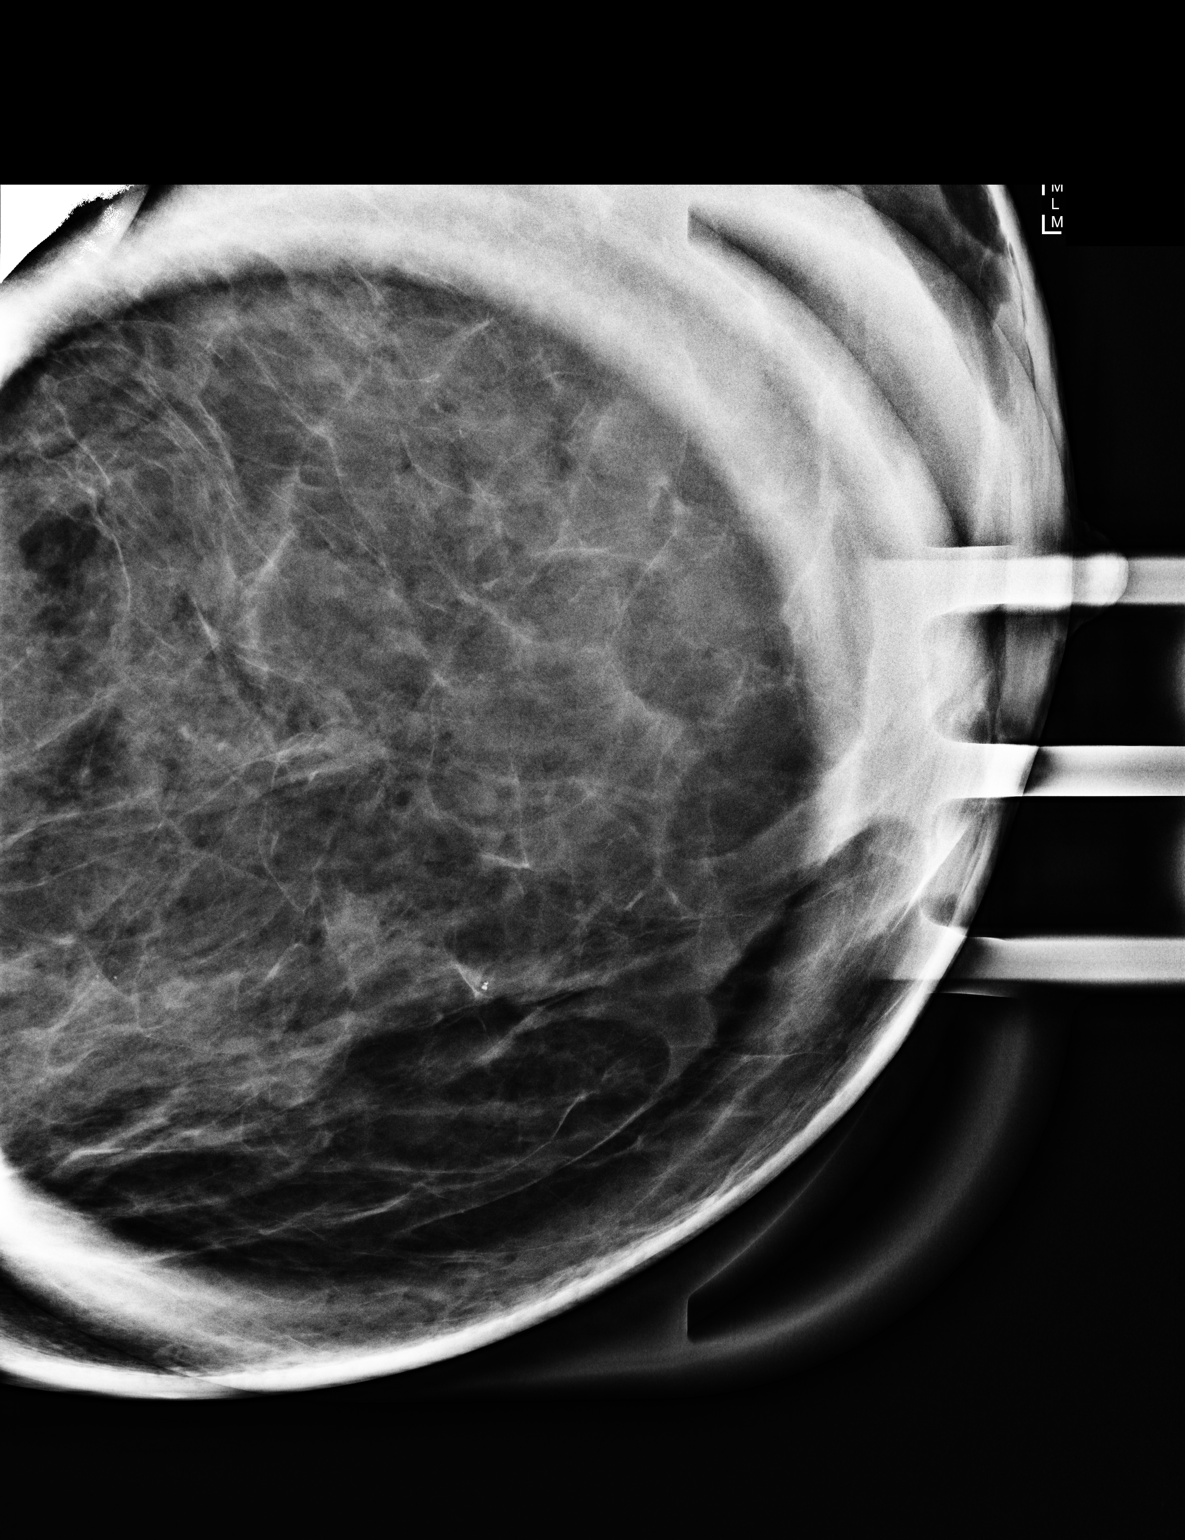

[L ML (1 of 2)]
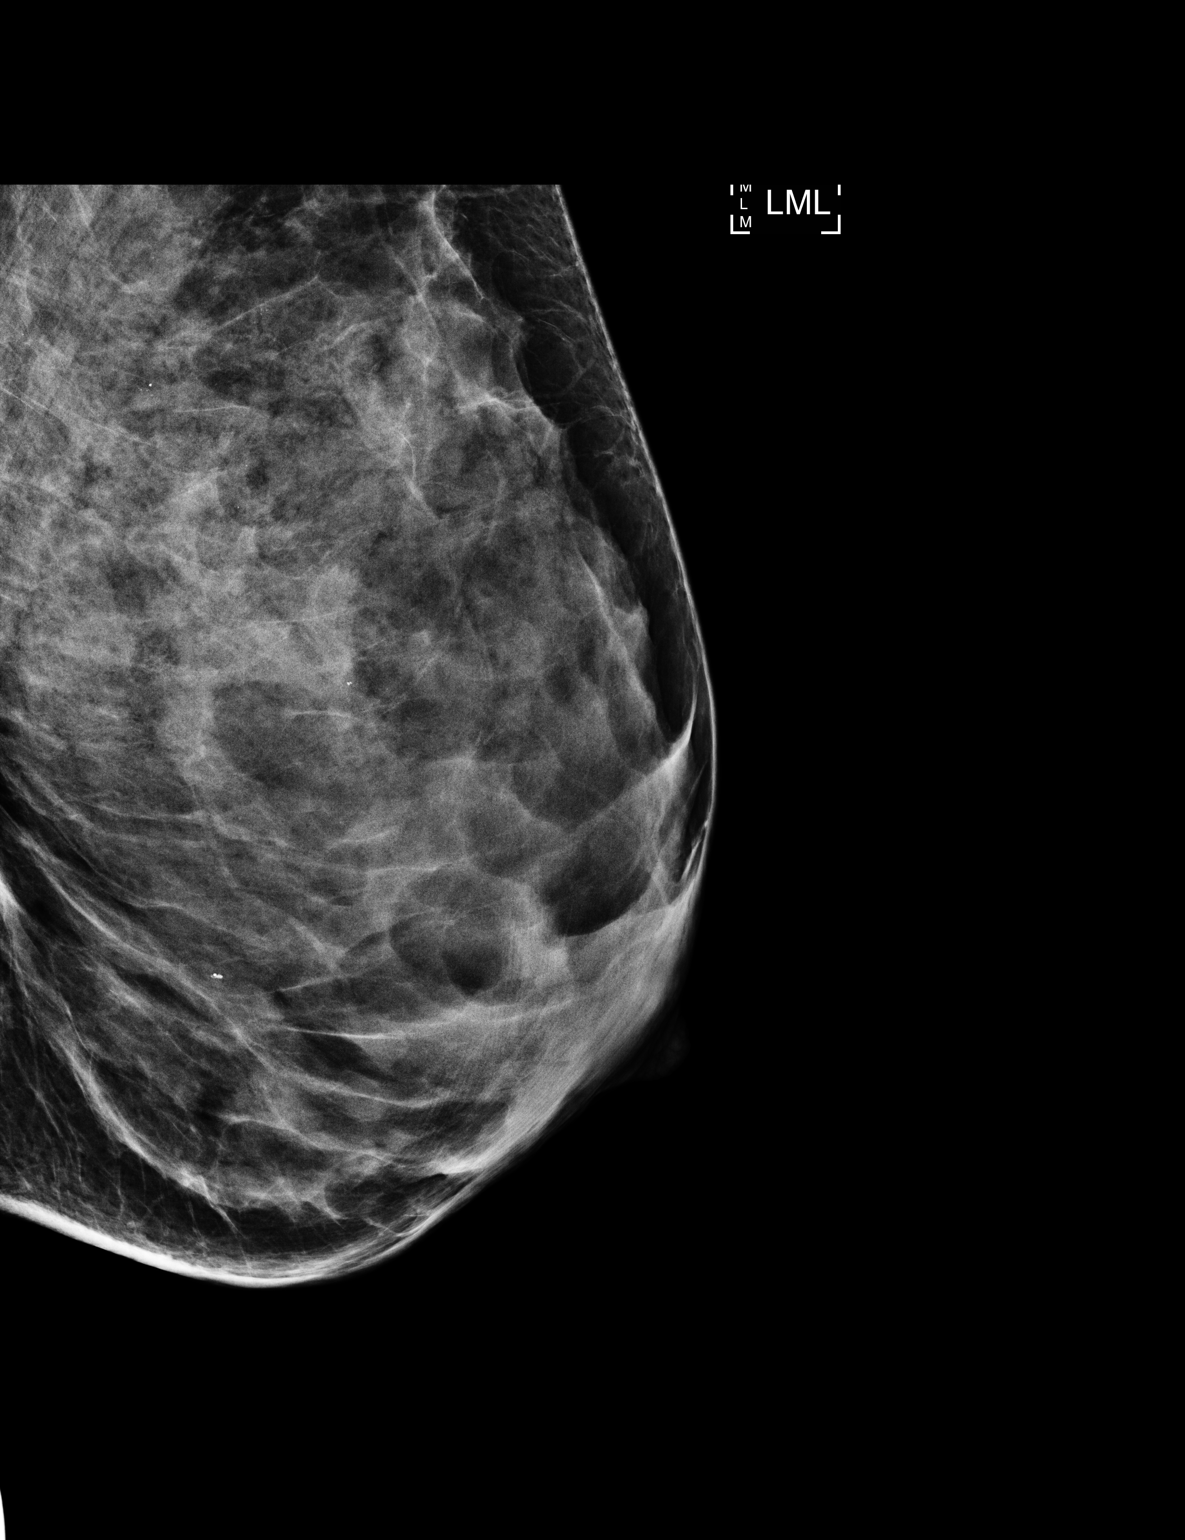

[L ML (2 of 2)]
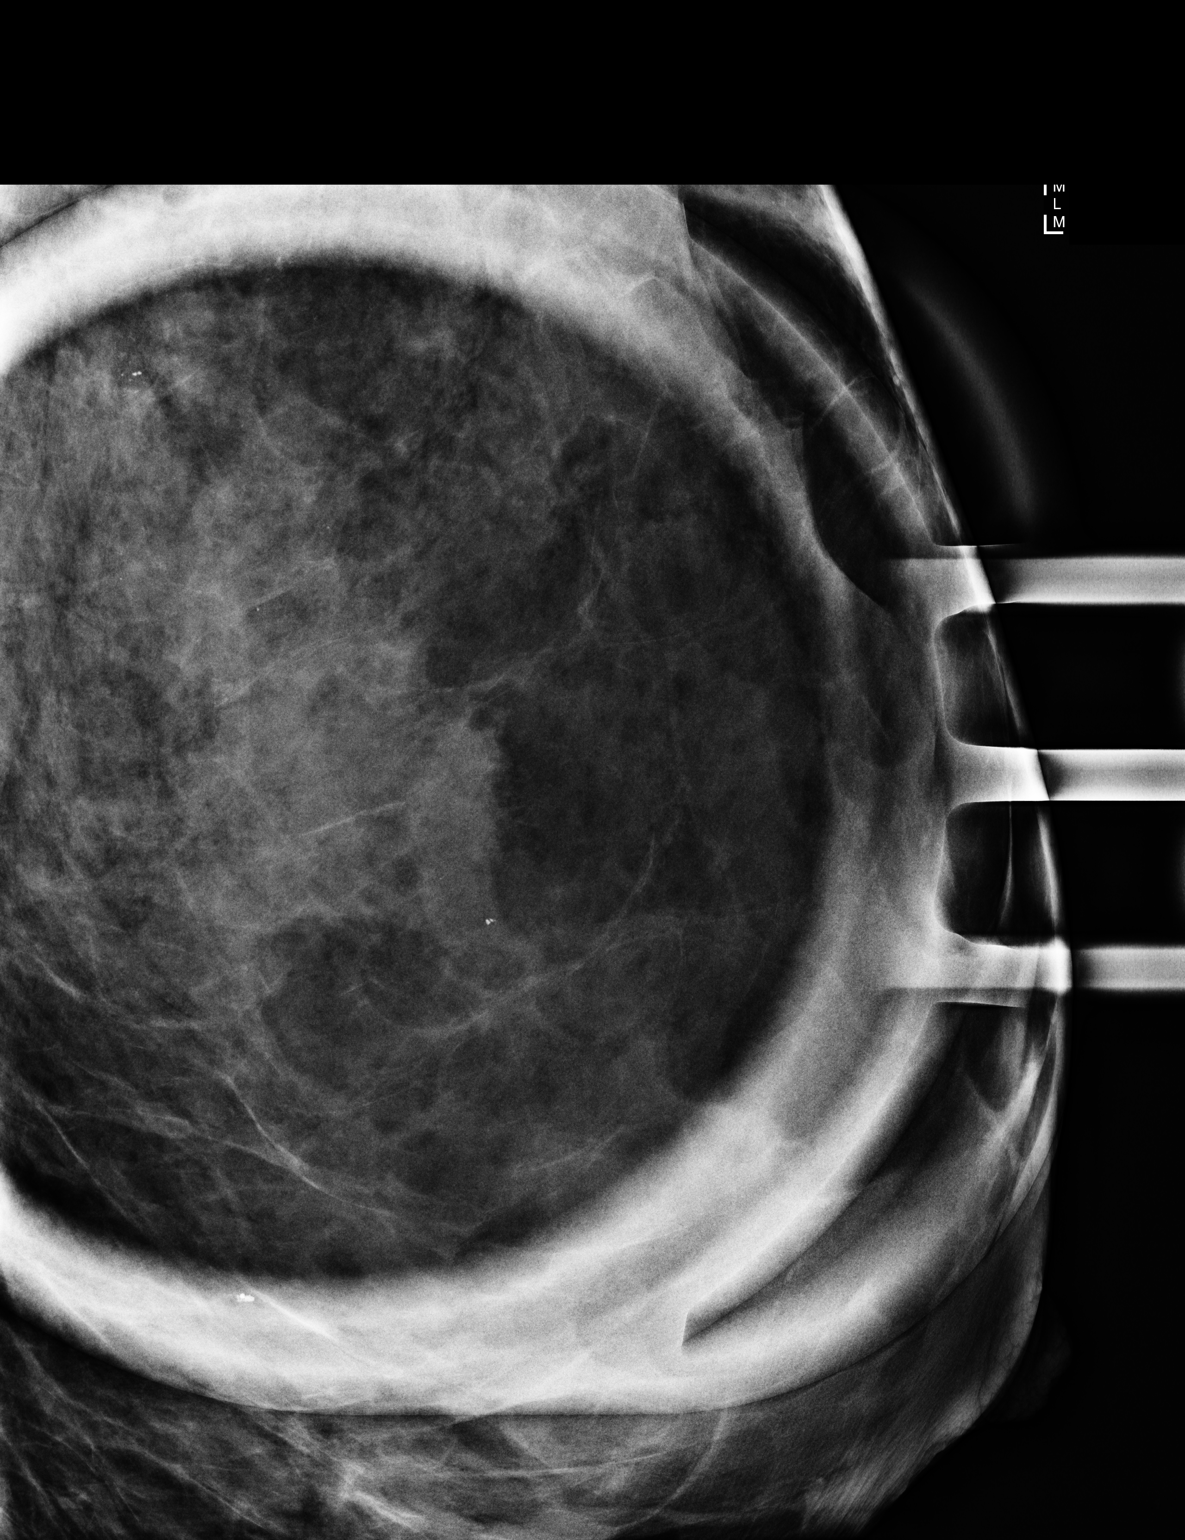

[3 of 3 positions shown; findings below may reference images not displayed]

ACR Breast Density Category c: The breast tissue is heterogeneously
dense, which may obscure small masses.
FINDINGS: Additional views are performed of calcifications in the upper inner
quadrant of the left breast. On magnified views, there are scattered
rounded calcifications without suspicious distribution or
morphology.

Mammographic images were processed with CAD.
IMPRESSION: Benign left breast calcifications. No mammographic evidence for
malignancy.

RECOMMENDATION:
Screening mammogram in one year.(Code:[id number])

I have discussed the findings and recommendations with the patient.
Results were also provided in writing at the conclusion of the
visit. If applicable, a reminder letter will be sent to the patient
regarding the next appointment.

BI-RADS CATEGORY  2: Benign.

## 2017-10-15 DIAGNOSIS — K219 Gastro-esophageal reflux disease without esophagitis: Secondary | ICD-10-CM | POA: Diagnosis not present

## 2018-02-08 DIAGNOSIS — R635 Abnormal weight gain: Secondary | ICD-10-CM | POA: Diagnosis not present

## 2018-02-08 DIAGNOSIS — Z8041 Family history of malignant neoplasm of ovary: Secondary | ICD-10-CM | POA: Diagnosis not present

## 2018-02-08 DIAGNOSIS — Z01419 Encounter for gynecological examination (general) (routine) without abnormal findings: Secondary | ICD-10-CM | POA: Diagnosis not present

## 2018-02-08 DIAGNOSIS — R5383 Other fatigue: Secondary | ICD-10-CM | POA: Diagnosis not present

## 2018-02-08 DIAGNOSIS — Z681 Body mass index (BMI) 19 or less, adult: Secondary | ICD-10-CM | POA: Diagnosis not present

## 2018-02-08 DIAGNOSIS — Z800 Family history of malignant neoplasm of digestive organs: Secondary | ICD-10-CM | POA: Diagnosis not present

## 2018-02-08 DIAGNOSIS — Z1231 Encounter for screening mammogram for malignant neoplasm of breast: Secondary | ICD-10-CM | POA: Diagnosis not present

## 2018-02-08 DIAGNOSIS — Z806 Family history of leukemia: Secondary | ICD-10-CM | POA: Diagnosis not present

## 2018-02-08 DIAGNOSIS — R002 Palpitations: Secondary | ICD-10-CM | POA: Diagnosis not present

## 2018-02-08 DIAGNOSIS — Z8042 Family history of malignant neoplasm of prostate: Secondary | ICD-10-CM | POA: Diagnosis not present

## 2018-04-13 DIAGNOSIS — M25511 Pain in right shoulder: Secondary | ICD-10-CM | POA: Diagnosis not present

## 2018-05-12 DIAGNOSIS — Z800 Family history of malignant neoplasm of digestive organs: Secondary | ICD-10-CM | POA: Diagnosis not present

## 2018-05-12 DIAGNOSIS — Z1211 Encounter for screening for malignant neoplasm of colon: Secondary | ICD-10-CM | POA: Diagnosis not present

## 2018-05-12 DIAGNOSIS — K648 Other hemorrhoids: Secondary | ICD-10-CM | POA: Diagnosis not present

## 2018-05-18 DIAGNOSIS — M25511 Pain in right shoulder: Secondary | ICD-10-CM | POA: Diagnosis not present

## 2018-05-20 DIAGNOSIS — M7501 Adhesive capsulitis of right shoulder: Secondary | ICD-10-CM | POA: Diagnosis not present

## 2018-05-24 DIAGNOSIS — M7501 Adhesive capsulitis of right shoulder: Secondary | ICD-10-CM | POA: Diagnosis not present

## 2018-05-27 DIAGNOSIS — M7501 Adhesive capsulitis of right shoulder: Secondary | ICD-10-CM | POA: Diagnosis not present

## 2018-05-30 DIAGNOSIS — M7501 Adhesive capsulitis of right shoulder: Secondary | ICD-10-CM | POA: Diagnosis not present

## 2018-06-02 DIAGNOSIS — M7501 Adhesive capsulitis of right shoulder: Secondary | ICD-10-CM | POA: Diagnosis not present

## 2018-06-07 DIAGNOSIS — M7501 Adhesive capsulitis of right shoulder: Secondary | ICD-10-CM | POA: Diagnosis not present

## 2018-06-10 DIAGNOSIS — M7501 Adhesive capsulitis of right shoulder: Secondary | ICD-10-CM | POA: Diagnosis not present

## 2018-06-13 DIAGNOSIS — M7501 Adhesive capsulitis of right shoulder: Secondary | ICD-10-CM | POA: Diagnosis not present

## 2018-06-20 DIAGNOSIS — M7501 Adhesive capsulitis of right shoulder: Secondary | ICD-10-CM | POA: Diagnosis not present

## 2018-06-24 DIAGNOSIS — M7501 Adhesive capsulitis of right shoulder: Secondary | ICD-10-CM | POA: Diagnosis not present

## 2018-06-28 DIAGNOSIS — M7501 Adhesive capsulitis of right shoulder: Secondary | ICD-10-CM | POA: Diagnosis not present

## 2018-06-29 DIAGNOSIS — M25511 Pain in right shoulder: Secondary | ICD-10-CM | POA: Diagnosis not present

## 2018-07-05 DIAGNOSIS — M7501 Adhesive capsulitis of right shoulder: Secondary | ICD-10-CM | POA: Diagnosis not present

## 2018-07-14 DIAGNOSIS — M7501 Adhesive capsulitis of right shoulder: Secondary | ICD-10-CM | POA: Diagnosis not present

## 2018-08-18 DIAGNOSIS — M7501 Adhesive capsulitis of right shoulder: Secondary | ICD-10-CM | POA: Diagnosis not present

## 2018-11-02 DIAGNOSIS — Z23 Encounter for immunization: Secondary | ICD-10-CM | POA: Diagnosis not present

## 2018-11-02 DIAGNOSIS — Z0000 Encounter for general adult medical examination without abnormal findings: Secondary | ICD-10-CM | POA: Diagnosis not present

## 2018-11-02 DIAGNOSIS — R7889 Finding of other specified substances, not normally found in blood: Secondary | ICD-10-CM | POA: Diagnosis not present

## 2018-11-02 DIAGNOSIS — K219 Gastro-esophageal reflux disease without esophagitis: Secondary | ICD-10-CM | POA: Diagnosis not present

## 2018-11-02 DIAGNOSIS — M810 Age-related osteoporosis without current pathological fracture: Secondary | ICD-10-CM | POA: Diagnosis not present

## 2018-11-02 DIAGNOSIS — I471 Supraventricular tachycardia: Secondary | ICD-10-CM | POA: Diagnosis not present

## 2018-11-02 DIAGNOSIS — R82998 Other abnormal findings in urine: Secondary | ICD-10-CM | POA: Diagnosis not present

## 2019-01-26 DIAGNOSIS — N951 Menopausal and female climacteric states: Secondary | ICD-10-CM | POA: Diagnosis not present

## 2019-03-29 DIAGNOSIS — Z1231 Encounter for screening mammogram for malignant neoplasm of breast: Secondary | ICD-10-CM | POA: Diagnosis not present

## 2019-03-29 DIAGNOSIS — Z681 Body mass index (BMI) 19 or less, adult: Secondary | ICD-10-CM | POA: Diagnosis not present

## 2019-03-29 DIAGNOSIS — Z01419 Encounter for gynecological examination (general) (routine) without abnormal findings: Secondary | ICD-10-CM | POA: Diagnosis not present

## 2019-12-18 DIAGNOSIS — M810 Age-related osteoporosis without current pathological fracture: Secondary | ICD-10-CM | POA: Diagnosis not present

## 2019-12-20 DIAGNOSIS — Z0000 Encounter for general adult medical examination without abnormal findings: Secondary | ICD-10-CM | POA: Diagnosis not present

## 2019-12-20 DIAGNOSIS — R7989 Other specified abnormal findings of blood chemistry: Secondary | ICD-10-CM | POA: Diagnosis not present

## 2019-12-20 DIAGNOSIS — M810 Age-related osteoporosis without current pathological fracture: Secondary | ICD-10-CM | POA: Diagnosis not present

## 2019-12-22 DIAGNOSIS — M7502 Adhesive capsulitis of left shoulder: Secondary | ICD-10-CM | POA: Diagnosis not present

## 2019-12-22 DIAGNOSIS — Z780 Asymptomatic menopausal state: Secondary | ICD-10-CM | POA: Diagnosis not present

## 2019-12-22 DIAGNOSIS — Z1331 Encounter for screening for depression: Secondary | ICD-10-CM | POA: Diagnosis not present

## 2019-12-22 DIAGNOSIS — M810 Age-related osteoporosis without current pathological fracture: Secondary | ICD-10-CM | POA: Diagnosis not present

## 2019-12-22 DIAGNOSIS — Z0000 Encounter for general adult medical examination without abnormal findings: Secondary | ICD-10-CM | POA: Diagnosis not present

## 2019-12-22 DIAGNOSIS — I471 Supraventricular tachycardia: Secondary | ICD-10-CM | POA: Diagnosis not present

## 2019-12-29 DIAGNOSIS — R825 Elevated urine levels of drugs, medicaments and biological substances: Secondary | ICD-10-CM | POA: Diagnosis not present

## 2020-04-01 DIAGNOSIS — Z681 Body mass index (BMI) 19 or less, adult: Secondary | ICD-10-CM | POA: Diagnosis not present

## 2020-04-01 DIAGNOSIS — Z01419 Encounter for gynecological examination (general) (routine) without abnormal findings: Secondary | ICD-10-CM | POA: Diagnosis not present

## 2020-12-20 DIAGNOSIS — R7989 Other specified abnormal findings of blood chemistry: Secondary | ICD-10-CM | POA: Diagnosis not present

## 2020-12-20 DIAGNOSIS — Z0000 Encounter for general adult medical examination without abnormal findings: Secondary | ICD-10-CM | POA: Diagnosis not present

## 2020-12-20 DIAGNOSIS — M810 Age-related osteoporosis without current pathological fracture: Secondary | ICD-10-CM | POA: Diagnosis not present

## 2020-12-24 DIAGNOSIS — R82998 Other abnormal findings in urine: Secondary | ICD-10-CM | POA: Diagnosis not present

## 2020-12-24 DIAGNOSIS — I471 Supraventricular tachycardia: Secondary | ICD-10-CM | POA: Diagnosis not present

## 2020-12-24 DIAGNOSIS — F1120 Opioid dependence, uncomplicated: Secondary | ICD-10-CM | POA: Diagnosis not present

## 2020-12-24 DIAGNOSIS — Z0000 Encounter for general adult medical examination without abnormal findings: Secondary | ICD-10-CM | POA: Diagnosis not present

## 2020-12-24 DIAGNOSIS — Z23 Encounter for immunization: Secondary | ICD-10-CM | POA: Diagnosis not present

## 2021-01-03 DIAGNOSIS — M810 Age-related osteoporosis without current pathological fracture: Secondary | ICD-10-CM | POA: Diagnosis not present

## 2021-01-10 ENCOUNTER — Ambulatory Visit (INDEPENDENT_AMBULATORY_CARE_PROVIDER_SITE_OTHER): Payer: BC Managed Care – PPO | Admitting: Internal Medicine

## 2021-01-10 ENCOUNTER — Encounter: Payer: Self-pay | Admitting: Internal Medicine

## 2021-01-10 ENCOUNTER — Other Ambulatory Visit: Payer: Self-pay

## 2021-01-10 VITALS — BP 102/66 | HR 70 | Ht 68.0 in | Wt 128.2 lb

## 2021-01-10 DIAGNOSIS — I471 Supraventricular tachycardia: Secondary | ICD-10-CM

## 2021-01-10 DIAGNOSIS — R002 Palpitations: Secondary | ICD-10-CM | POA: Diagnosis not present

## 2021-01-10 NOTE — Patient Instructions (Addendum)
Medication Instructions:  Your physician recommends that you continue on your current medications as directed. Please refer to the Current Medication list given to you today.  Labwork: None ordered.  Testing/Procedures:  Your physician has requested that you have an echocardiogram. Echocardiography is a painless test that uses sound waves to create images of your heart. It provides your doctor with information about the size and shape of your heart and how well your heart's chambers and valves are working. This procedure takes approximately one hour. There are no restrictions for this procedure.   Follow-Up: Your physician wants you to follow-up in: 6 months with Hillis Range, MD   Any Other Special Instructions Will Be Listed Below (If Applicable).  If you need a refill on your cardiac medications before your next appointment, please call your pharmacy.    [employer]  FDA-cleared EKG at your fingertips. - [employer]   [employer], [employer]. https://store.alivecor.com/products/[email or web address]   FDA-cleared, clinical grade mobile EKG monitor: Lourena Simmonds is the most clinically-validated mobile EKG used by the world's leading cardiac care medical professionals.  This may be useful in monitoring palpitations.  We do not have access to have them emailed and reviewed but will be glad to review while in the office.

## 2021-01-10 NOTE — Progress Notes (Signed)
Electrophysiology Office Note   Date:  01/10/2021   ID:  Cindy Blanchard, DOB 1966-01-06, MRN [id number]  PCP:  Dr Waynard Edwards Primary Electrophysiologist: Hillis Range, MD    CC: palpitations   History of Present Illness: Cindy Blanchard is a 55 y.o. female who presents today for electrophysiology evaluation.   She is referred by Dr Waynard Edwards for EP consultation.  She has had intermittent abrupt onset/ offset tachypalpitations for about 10 years.  Episodes occur mostly every few months.  She is unaware of triggers/ precipitants. Episodes typically only last a minute or two but rarely up to 30 minutes.  Today, she denies symptoms of palpitations, chest pain, shortness of breath, orthopnea, PND, lower extremity edema, claudication, dizziness, presyncope, syncope, bleeding, or neurologic sequela. The patient is tolerating medications without difficulties and is otherwise without complaint today.    Past Medical History:  Diagnosis Date  . History of PSVT (paroxysmal supraventricular tachycardia)   . Osteoporosis    Past Surgical History:  Procedure Laterality Date  . ANTERIOR AND POSTERIOR REPAIR WITH SACROSPINOUS FIXATION N/A 03/28/2015   Procedure: ANTERIOR AND POSTERIOR REPAIR ;  Surgeon: Richarda Overlie, MD;  Location: [facility];  Service: Gynecology;  Laterality: N/A;  . BILATERAL SALPINGECTOMY Bilateral 03/28/2015   Procedure: BILATERAL SALPINGECTOMY;  Surgeon: Richarda Overlie, MD;  Location: [facility];  Service: Gynecology;  Laterality: Bilateral;  . CHOLECYSTECTOMY    . cholycystectomy 2011    . COLONOSCOPY     2003 and 2014  . DILATION AND CURETTAGE OF UTERUS     1994 after delivery   . LAPAROSCOPIC ASSISTED VAGINAL HYSTERECTOMY N/A 03/28/2015   Procedure: LAPAROSCOPIC ASSISTED VAGINAL HYSTERECTOMY;  Surgeon: Richarda Overlie, MD;  Location: [facility];  Service: Gynecology;  Laterality: N/A;  . PUBOVAGINAL SLING N/A 03/28/2015   Procedure: Chandra Batch SINGLE INCISION SLING WITH CYSTO;  Surgeon: Richarda Overlie, MD;  Location: [facility];  Service: Gynecology;  Laterality: N/A;     Current Outpatient Medications  Medication Sig Dispense Refill  . Calcium Carbonate-Vit D-Min (CALTRATE 600+D PLUS MINERALS) 600-800 MG-UNIT TABS Take 1 tablet by mouth daily.    Marland Kitchen estradiol (CLIMARA - DOSED IN MG/24 HR) 0.05 mg/24hr patch Place 0.05 mg onto the skin once a week.    . Multiple Vitamin (MULTIVITAMIN WITH MINERALS) TABS tablet Take 1 tablet by mouth daily.    Marland Kitchen zolpidem (AMBIEN) 5 MG tablet Take 5 mg by mouth at bedtime as needed for anxiety.     No current facility-administered medications for this visit.    Allergies:   Patient has no known allergies.   Social History:  The patient  reports that she has never smoked. She has never used smokeless tobacco. She reports current alcohol use. She reports that she does not use drugs.   Family History:  The patient's family history includes CAD in her paternal grandmother.    ROS:  Please see the history of present illness.   All other systems are personally reviewed and negative.    PHYSICAL EXAM: VS:  BP 102/66   Pulse 70   Ht 5\' 8"  (1.727 m)   Wt 128 lb 3.2 oz (58.2 kg)   SpO2 98%   BMI 19.49 kg/m  , BMI Body mass index is 19.49 kg/m. GEN: Well nourished, well developed, in no acute distress HEENT: normal Neck: no JVD, carotid bruits, or masses Cardiac: RRR; no murmurs, rubs, or gallops,no edema  Respiratory:  clear to auscultation bilaterally, normal work of breathing GI: soft, nontender,  nondistended, + BS MS: no deformity or atrophy Skin: warm and dry  Neuro:  Strength and sensation are intact Psych: euthymic mood, full affect  EKG:  EKG is ordered today. The ekg ordered today is personally reviewed and shows sinus rhythm 70 bpm, PR 142 msec, QRS 80 msec, Qtc 427 msec   Recent Labs: No results found for requested labs within last 8760 hours.  personally reviewed   Lipid Panel  No results found for: CHOL, TRIG, HDL, CHOLHDL,  VLDL, LDLCALC, LDLDIRECT personally reviewed   Wt Readings from Last 3 Encounters:  01/10/21 128 lb 3.2 oz (58.2 kg)  03/28/15 129 lb (58.5 kg)  03/20/15 129 lb 6 oz (58.7 kg)      Other studies personally reviewed: Additional studies/ records that were reviewed today include: Dr Perini's notes  Review of the above records today demonstrates: as above   ASSESSMENT AND PLAN:  1.  tachypalpitations Unclear etiology but likely SVT She has not had an ekg or rhythm strip to capture events previously. Before discussing medical therapy or ablation, I would like to further capture her arrhythmia. We discussed KardiaMobile at length today or Apple Watch as wearable options to further classify her symptoms.  She will likely purchase KardiaMobile and then return to review strips with me in 6 months.  I will obtain an echo to evaluate for structural heart disease in the interim.  She is not very excited about ablation or medical therapy.    Follow-up:  6 months    Signed, Hillis Range, MD  01/10/2021 10:10 AM     [facility] [address] [place] [address] [phone] (office) [phone] (fax)

## 2021-02-06 ENCOUNTER — Other Ambulatory Visit: Payer: Self-pay

## 2021-02-06 ENCOUNTER — Ambulatory Visit (HOSPITAL_COMMUNITY): Payer: BC Managed Care – PPO | Attending: Cardiology

## 2021-02-06 DIAGNOSIS — R002 Palpitations: Secondary | ICD-10-CM

## 2021-02-06 LAB — ECHOCARDIOGRAM COMPLETE
Area-P 1/2: 3.03 cm2
S' Lateral: 2.4 cm

## 2021-05-05 DIAGNOSIS — Z01419 Encounter for gynecological examination (general) (routine) without abnormal findings: Secondary | ICD-10-CM | POA: Diagnosis not present

## 2021-05-05 DIAGNOSIS — Z1231 Encounter for screening mammogram for malignant neoplasm of breast: Secondary | ICD-10-CM | POA: Diagnosis not present

## 2021-05-05 DIAGNOSIS — Z681 Body mass index (BMI) 19 or less, adult: Secondary | ICD-10-CM | POA: Diagnosis not present

## 2021-06-04 DIAGNOSIS — K648 Other hemorrhoids: Secondary | ICD-10-CM | POA: Diagnosis not present

## 2021-06-25 DIAGNOSIS — K648 Other hemorrhoids: Secondary | ICD-10-CM | POA: Diagnosis not present

## 2021-07-09 DIAGNOSIS — K648 Other hemorrhoids: Secondary | ICD-10-CM | POA: Diagnosis not present

## 2021-07-14 ENCOUNTER — Ambulatory Visit: Payer: BC Managed Care – PPO | Admitting: Internal Medicine

## 2021-07-16 ENCOUNTER — Ambulatory Visit (INDEPENDENT_AMBULATORY_CARE_PROVIDER_SITE_OTHER): Payer: BC Managed Care – PPO | Admitting: Internal Medicine

## 2021-07-16 ENCOUNTER — Other Ambulatory Visit: Payer: Self-pay

## 2021-07-16 VITALS — BP 110/70 | HR 75 | Ht 68.0 in | Wt 124.8 lb

## 2021-07-16 DIAGNOSIS — I471 Supraventricular tachycardia: Secondary | ICD-10-CM

## 2021-07-16 DIAGNOSIS — R002 Palpitations: Secondary | ICD-10-CM | POA: Diagnosis not present

## 2021-07-16 NOTE — Patient Instructions (Addendum)
Medication Instructions:  Your physician recommends that you continue on your current medications as directed. Please refer to the Current Medication list given to you today.  Labwork: None ordered.  Testing/Procedures: Please schedule echo a few days prior to follow up.  Your physician has requested that you have an echocardiogram. Echocardiography is a painless test that uses sound waves to create images of your heart. It provides your doctor with information about the size and shape of your heart and how well your heart's chambers and valves are working. This procedure takes approximately one hour. There are no restrictions for this procedure.   Follow-Up: Your physician wants you to follow-up in: In April 2023 with Dr. Ladona Ridgel.   Any Other Special Instructions Will Be Listed Below (If Applicable).  If you need a refill on your cardiac medications before your next appointment, please call your pharmacy.

## 2021-07-16 NOTE — Telephone Encounter (Signed)
Dr. Johney Frame reviewed during office visit today.

## 2021-07-16 NOTE — Progress Notes (Signed)
PCP: Pcp, No   Primary EP: Dr Johney Frame  Cindy Blanchard is a 55 y.o. female who presents today for routine electrophysiology followup.  Since last being seen in our clinic, the patient reports doing very well.  She has SVT eveny 2-3 months, typically lasting up to 10 minutes.  Denies CP or SOB during events.  Denies syncope.   Today, she denies symptoms of palpitations, chest pain, shortness of breath,  lower extremity edema, dizziness, presyncope, or syncope.  The patient is otherwise without complaint today.   Past Medical History:  Diagnosis Date   History of PSVT (paroxysmal supraventricular tachycardia)    Osteoporosis    Past Surgical History:  Procedure Laterality Date   ANTERIOR AND POSTERIOR REPAIR WITH SACROSPINOUS FIXATION N/A 03/28/2015   Procedure: ANTERIOR AND POSTERIOR REPAIR ;  Surgeon: Richarda Overlie, MD;  [facility]: [facility];  Service: Gynecology;  Laterality: N/A;   BILATERAL SALPINGECTOMY Bilateral 03/28/2015   Procedure: BILATERAL SALPINGECTOMY;  Surgeon: Richarda Overlie, MD;  [facility]: [facility];  Service: Gynecology;  Laterality: Bilateral;   CHOLECYSTECTOMY     cholycystectomy 2011     COLONOSCOPY     2003 and 2014   DILATION AND CURETTAGE OF UTERUS     1994 after delivery    LAPAROSCOPIC ASSISTED VAGINAL HYSTERECTOMY N/A 03/28/2015   Procedure: LAPAROSCOPIC ASSISTED VAGINAL HYSTERECTOMY;  Surgeon: Richarda Overlie, MD;  [facility];  Service: Gynecology;  Laterality: N/A;   PUBOVAGINAL SLING N/A 03/28/2015   Procedure: Chandra Batch SINGLE INCISION SLING WITH CYSTO;  Surgeon: Richarda Overlie, MD;  [facility];  Service: Gynecology;  Laterality: N/A;    ROS- all systems are reviewed and negatives except as per HPI above  Current Outpatient Medications  Medication Sig Dispense Refill   Calcium Carbonate-Vit D-Min (CALTRATE 600+D PLUS MINERALS) 600-800 MG-UNIT TABS Take 1 tablet by mouth daily.     estradiol (CLIMARA - DOSED IN MG/24 HR) 0.05 mg/24hr patch Place 0.05 mg  onto the skin once a week.     Multiple Vitamin (MULTIVITAMIN WITH MINERALS) TABS tablet Take 1 tablet by mouth daily.     zolpidem (AMBIEN) 5 MG tablet Take 5 mg by mouth at bedtime as needed for anxiety.     No current facility-administered medications for this visit.    Physical Exam: Vitals:   07/16/21 0937  BP: 110/70  Pulse: 75  SpO2: 98%  Weight: 124 lb 12.8 oz (56.6 kg)  Height: 5\' 8"  (1.727 m)    GEN- The patient is well appearing, alert and oriented x 3 today.   Head- normocephalic, atraumatic Eyes-  Sclera clear, conjunctiva pink Ears- hearing intact Oropharynx- clear Lungs- Clear to ausculation bilaterally, normal work of breathing Heart- Regular rate and rhythm, no murmurs, rubs or gallops, PMI not laterally displaced GI- soft, NT, ND, + BS Extremities- no clubbing, cyanosis, or edema  Wt Readings from Last 3 Encounters:  07/16/21 124 lb 12.8 oz (56.6 kg)  01/10/21 128 lb 3.2 oz (58.2 kg)  03/28/15 129 lb (58.5 kg)    EKG tracing ordered today is personally reviewed and shows sinus rhythm  Apple watch tracing from 07/15/21 patient message documents clear SVt at 227 bpm  Assessment and Plan:  SVT Therapeutic strategies for supraventricular tachycardia including medicine and ablation were discussed in detail with the patient today. Risk, benefits, and alternatives to EP study and radiofrequency ablation were also discussed in detail today. At this time, she wishes to continue watchful waiting.  She may consider ablation  if episodes worsen  2. Abnormal echo Reviewed echo with her at length from 4/22 This showed "mild RV enlargement, degenerative mitral valve with trivial MR, mildly enlarged aorta". We will repeat echo on return  Return 4/22 to see Dr Ladona Ridgel for SVT follow-up.  Hillis Range MD, [facility] 07/16/2021 9:53 AM

## 2022-01-27 ENCOUNTER — Ambulatory Visit (HOSPITAL_COMMUNITY): Payer: BC Managed Care – PPO | Attending: Cardiology

## 2022-01-27 DIAGNOSIS — I471 Supraventricular tachycardia: Secondary | ICD-10-CM | POA: Diagnosis not present

## 2022-01-27 DIAGNOSIS — R002 Palpitations: Secondary | ICD-10-CM | POA: Diagnosis not present

## 2022-01-27 LAB — ECHOCARDIOGRAM COMPLETE
Area-P 1/2: 4.06 cm2
S' Lateral: 3 cm

## 2022-01-29 DIAGNOSIS — R7989 Other specified abnormal findings of blood chemistry: Secondary | ICD-10-CM | POA: Diagnosis not present

## 2022-01-29 DIAGNOSIS — Z0000 Encounter for general adult medical examination without abnormal findings: Secondary | ICD-10-CM | POA: Diagnosis not present

## 2022-01-29 DIAGNOSIS — M810 Age-related osteoporosis without current pathological fracture: Secondary | ICD-10-CM | POA: Diagnosis not present

## 2022-02-05 DIAGNOSIS — M810 Age-related osteoporosis without current pathological fracture: Secondary | ICD-10-CM | POA: Diagnosis not present

## 2022-02-06 DIAGNOSIS — M810 Age-related osteoporosis without current pathological fracture: Secondary | ICD-10-CM | POA: Diagnosis not present

## 2022-02-06 DIAGNOSIS — Z0000 Encounter for general adult medical examination without abnormal findings: Secondary | ICD-10-CM | POA: Diagnosis not present

## 2022-02-06 DIAGNOSIS — I471 Supraventricular tachycardia: Secondary | ICD-10-CM | POA: Diagnosis not present

## 2022-02-10 DIAGNOSIS — J029 Acute pharyngitis, unspecified: Secondary | ICD-10-CM | POA: Diagnosis not present

## 2022-02-12 ENCOUNTER — Telehealth: Payer: Self-pay | Admitting: Internal Medicine

## 2022-02-12 NOTE — Telephone Encounter (Signed)
Spoke with the patient and advised that I will have our schedulers reach out to reschedule her appointment. Appointment for tomorrow has been cancelled.  ?

## 2022-02-12 NOTE — Telephone Encounter (Signed)
Pt has appt tomorrow, 4/21 with Dr. Ladona Ridgel. Pt states that she thinks she may have Covid and wanted to know if she can change her appt to Virtual. Please advise ?

## 2022-02-13 ENCOUNTER — Ambulatory Visit: Payer: BC Managed Care – PPO | Admitting: Internal Medicine

## 2022-02-26 DIAGNOSIS — M810 Age-related osteoporosis without current pathological fracture: Secondary | ICD-10-CM | POA: Diagnosis not present

## 2022-03-17 ENCOUNTER — Ambulatory Visit (INDEPENDENT_AMBULATORY_CARE_PROVIDER_SITE_OTHER): Payer: BC Managed Care – PPO | Admitting: Internal Medicine

## 2022-03-17 ENCOUNTER — Encounter: Payer: Self-pay | Admitting: Internal Medicine

## 2022-03-17 DIAGNOSIS — I471 Supraventricular tachycardia, unspecified: Secondary | ICD-10-CM

## 2022-03-17 HISTORY — DX: Supraventricular tachycardia, unspecified: I47.10

## 2022-03-17 NOTE — Patient Instructions (Addendum)
Medication Instructions:  ?Your physician recommends that you continue on your current medications as directed. Please refer to the Current Medication list given to you today. ? ?Labwork: ?None ordered. ? ?Testing/Procedures: ?None ordered. ? ?Follow-Up: ?Your physician wants you to follow-up in: as needed with Gregg Taylor, MD  ? ? ? ?Any Other Special Instructions Will Be Listed Below (If Applicable). ? ?If you need a refill on your cardiac medications before your next appointment, please call your pharmacy.  ? ?Important Information About Sugar ? ? ? ? ? ? ? ?

## 2022-03-17 NOTE — Progress Notes (Signed)
HPI Ms.Cindy Blanchard returns today for followup. She is a very well appearing middle aged woman with a h/o SVT and palpitations. She notes symptoms for several years. The spells of SVT start and stop suddenly. No chest pain or sob. No edema. No syncope. The patient will have an episode of palpitations which usually don't last over 20 minutes and respond to vagal maneuvers. No Known Allergies   Current Outpatient Medications  Medication Sig Dispense Refill   Calcium Carbonate-Vit D-Min (CALTRATE 600+D PLUS MINERALS) 600-800 MG-UNIT TABS Take 1 tablet by mouth daily.     estradiol (CLIMARA - DOSED IN MG/24 HR) 0.05 mg/24hr patch Place 0.05 mg onto the skin once a week.     Multiple Vitamin (MULTIVITAMIN WITH MINERALS) TABS tablet Take 1 tablet by mouth daily.     zolpidem (AMBIEN) 5 MG tablet Take 5 mg by mouth at bedtime as needed for anxiety.     No current facility-administered medications for this visit.     Past Medical History:  Diagnosis Date   History of PSVT (paroxysmal supraventricular tachycardia)    Osteoporosis     ROS:   All systems reviewed and negative except as noted in the HPI.   Past Surgical History:  Procedure Laterality Date   ANTERIOR AND POSTERIOR REPAIR WITH SACROSPINOUS FIXATION N/A 03/28/2015   Procedure: ANTERIOR AND POSTERIOR REPAIR ;  Surgeon: Richarda Overlie, MD;  Location: [facility];  Service: Gynecology;  Laterality: N/A;   BILATERAL SALPINGECTOMY Bilateral 03/28/2015   Procedure: BILATERAL SALPINGECTOMY;  Surgeon: Richarda Overlie, MD;  Location: [facility];  Service: Gynecology;  Laterality: Bilateral;   CHOLECYSTECTOMY     cholycystectomy 2011     COLONOSCOPY     2003 and 2014   DILATION AND CURETTAGE OF UTERUS     1994 after delivery    LAPAROSCOPIC ASSISTED VAGINAL HYSTERECTOMY N/A 03/28/2015   Procedure: LAPAROSCOPIC ASSISTED VAGINAL HYSTERECTOMY;  Surgeon: Richarda Overlie, MD;  Location: [facility];  Service: Gynecology;  Laterality: N/A;   PUBOVAGINAL  SLING N/A 03/28/2015   Procedure: Chandra Batch SINGLE INCISION SLING WITH CYSTO;  Surgeon: Richarda Overlie, MD;  Location: [facility];  Service: Gynecology;  Laterality: N/A;     Family History  Problem Relation Age of Onset   CAD Paternal Grandmother      Social History   Socioeconomic History   Marital status: Married    Spouse name: Not on file   Number of children: Not on file   Years of education: Not on file   Highest education level: Not on file  Occupational History   Not on file  Tobacco Use   Smoking status: Never   Smokeless tobacco: Never  Substance and Sexual Activity   Alcohol use: Yes    Comment: occasional    Drug use: No   Sexual activity: Yes  Other Topics Concern   Not on file  Social History Narrative   Lives in [place]   Nurse - NICU at [employer]      Husband is a retired Armed forces operational officer   Social Determinants of Corporate investment banker Strain: Not on file  Food Insecurity: Not on file  Transportation Needs: Not on file  Physical Activity: Not on file  Stress: Not on file  Social Connections: Not on file  Intimate Partner Violence: Not on file     BP 110/70   Pulse 79   Ht 5\' 8"  (1.727 m)   Wt 126 lb (57.2 kg)  SpO2 97%   BMI 19.16 kg/m   Physical Exam:  Well appearing NAD HEENT: Unremarkable Neck:  No JVD, no thyromegally Lymphatics:  No adenopathy Back:  No CVA tenderness Lungs:  Clear with no wheezes HEART:  Regular rate rhythm, no murmurs, no rubs, no clicks Abd:  soft, positive bowel sounds, no organomegally, no rebound, no guarding Ext:  2 plus pulses, no edema, no cyanosis, no clubbing Skin:  No rashes no nodules Neuro:  CN II through XII intact, motor grossly intact  Assess/Plan:  SVT - Ihave discussed the treatment options with the patient. The risks/benefits/goals/expectations of EP study and ablation were reviewed and she will call us if she would like to catheter ablation.   Sharlot Gowda Cindy Sparlin,MD

## 2022-05-06 DIAGNOSIS — Z01419 Encounter for gynecological examination (general) (routine) without abnormal findings: Secondary | ICD-10-CM | POA: Diagnosis not present

## 2022-05-06 DIAGNOSIS — Z1231 Encounter for screening mammogram for malignant neoplasm of breast: Secondary | ICD-10-CM | POA: Diagnosis not present

## 2022-05-06 DIAGNOSIS — Z681 Body mass index (BMI) 19 or less, adult: Secondary | ICD-10-CM | POA: Diagnosis not present

## 2022-09-04 DIAGNOSIS — M25532 Pain in left wrist: Secondary | ICD-10-CM | POA: Diagnosis not present

## 2023-05-04 DIAGNOSIS — R7989 Other specified abnormal findings of blood chemistry: Secondary | ICD-10-CM | POA: Diagnosis not present

## 2023-05-04 DIAGNOSIS — M810 Age-related osteoporosis without current pathological fracture: Secondary | ICD-10-CM | POA: Diagnosis not present

## 2023-05-10 DIAGNOSIS — Z1231 Encounter for screening mammogram for malignant neoplasm of breast: Secondary | ICD-10-CM | POA: Diagnosis not present

## 2023-05-10 DIAGNOSIS — Z681 Body mass index (BMI) 19 or less, adult: Secondary | ICD-10-CM | POA: Diagnosis not present

## 2023-05-10 DIAGNOSIS — Z01419 Encounter for gynecological examination (general) (routine) without abnormal findings: Secondary | ICD-10-CM | POA: Diagnosis not present

## 2023-05-11 DIAGNOSIS — M810 Age-related osteoporosis without current pathological fracture: Secondary | ICD-10-CM | POA: Diagnosis not present

## 2023-05-11 DIAGNOSIS — R82998 Other abnormal findings in urine: Secondary | ICD-10-CM | POA: Diagnosis not present

## 2023-05-11 DIAGNOSIS — K219 Gastro-esophageal reflux disease without esophagitis: Secondary | ICD-10-CM | POA: Diagnosis not present

## 2023-05-11 DIAGNOSIS — D649 Anemia, unspecified: Secondary | ICD-10-CM | POA: Diagnosis not present

## 2023-05-11 DIAGNOSIS — Z0000 Encounter for general adult medical examination without abnormal findings: Secondary | ICD-10-CM | POA: Diagnosis not present

## 2023-05-12 ENCOUNTER — Other Ambulatory Visit: Payer: Self-pay | Admitting: Obstetrics and Gynecology

## 2023-05-12 DIAGNOSIS — R928 Other abnormal and inconclusive findings on diagnostic imaging of breast: Secondary | ICD-10-CM

## 2023-05-25 ENCOUNTER — Ambulatory Visit
Admission: RE | Admit: 2023-05-25 | Discharge: 2023-05-25 | Disposition: A | Discharge status: Home / Self Care | Payer: BC Managed Care – PPO | Source: Ambulatory Visit | Attending: Obstetrics and Gynecology | Admitting: Obstetrics and Gynecology

## 2023-05-25 DIAGNOSIS — R928 Other abnormal and inconclusive findings on diagnostic imaging of breast: Secondary | ICD-10-CM

## 2023-07-02 ENCOUNTER — Encounter: Payer: Self-pay | Admitting: Gastroenterology

## 2023-07-05 ENCOUNTER — Encounter: Payer: Self-pay | Admitting: Gastroenterology

## 2023-07-06 DIAGNOSIS — D649 Anemia, unspecified: Secondary | ICD-10-CM | POA: Diagnosis not present

## 2023-07-06 DIAGNOSIS — M810 Age-related osteoporosis without current pathological fracture: Secondary | ICD-10-CM | POA: Diagnosis not present

## 2023-07-06 DIAGNOSIS — R7989 Other specified abnormal findings of blood chemistry: Secondary | ICD-10-CM | POA: Diagnosis not present

## 2023-11-02 ENCOUNTER — Encounter: Payer: Self-pay | Admitting: Gastroenterology

## 2023-11-02 ENCOUNTER — Ambulatory Visit: Payer: BC Managed Care – PPO | Admitting: Gastroenterology

## 2023-11-02 VITALS — BP 90/64 | HR 80 | Ht 68.0 in | Wt 128.2 lb

## 2023-11-02 DIAGNOSIS — Z9049 Acquired absence of other specified parts of digestive tract: Secondary | ICD-10-CM | POA: Diagnosis not present

## 2023-11-02 DIAGNOSIS — Z860100 Personal history of colon polyps, unspecified: Secondary | ICD-10-CM | POA: Diagnosis not present

## 2023-11-02 MED ORDER — SUTAB 1479-225-188 MG PO TABS
24.0000 | ORAL_TABLET | Freq: Once | ORAL | 0 refills | Status: AC
Start: 2023-11-02 — End: 2023-11-02

## 2023-11-02 NOTE — Patient Instructions (Addendum)
 You have been scheduled for a colonoscopy. Please follow written instructions given to you at your visit today.   Please pick up your prep supplies at the pharmacy within the next 1-3 days.  If you use inhalers (even only as needed), please bring them with you on the day of your procedure.  DO NOT TAKE 7 DAYS PRIOR TO TEST- Trulicity (dulaglutide) Ozempic, Wegovy (semaglutide) Mounjaro (tirzepatide) Bydureon Bcise (exanatide extended release)  DO NOT TAKE 1 DAY PRIOR TO YOUR TEST Rybelsus (semaglutide) Adlyxin (lixisenatide) Victoza (liraglutide) Byetta (exanatide) ___________________________________________________________________________  We will request records of your recent labs from Dr. Sheppard office.  Thank you for entrusting me with your care and for choosing [facility], Dr. Elspeth Naval     If your blood pressure at your visit was 140/90 or greater, please contact your primary care physician to follow up on this. ______________________________________________________  If you are age 74 or older, your body mass index should be between 23-30. Your Body mass index is 19.5 kg/m. If this is out of the aforementioned range listed, please consider follow up with your Primary Care Provider.  If you are age 26 or younger, your body mass index should be between 19-25. Your Body mass index is 19.5 kg/m. If this is out of the aformentioned range listed, please consider follow up with your Primary Care Provider.  ________________________________________________________  The [facility] providers would like to encourage you to use MYCHART to communicate with providers for non-urgent requests or questions.  Due to long hold times on the telephone, sending your provider a message by [facility] may be a faster and more efficient way to get a response.  Please allow 48 business hours for a response.  Please remember that this is for non-urgent requests.   _______________________________________________________  Due to recent changes in healthcare laws, you may see the results of your imaging and laboratory studies on MyChart before your provider has had a chance to review them.  We understand that in some cases there may be results that are confusing or concerning to you. Not all laboratory results come back in the same time frame and the provider may be waiting for multiple results in order to interpret others.  Please give us  48 hours in order for your provider to thoroughly review all the results before contacting the office for clarification of your results.

## 2023-11-02 NOTE — Progress Notes (Signed)
 HPI :  58 year old female here to establish care for history of colon polyps, history of hemorrhoids.  She was previously followed by Dr. Luis.   Her last colonoscopy was in July 2019 which was normal and findings of internal hemorrhoids.  She was told to repeat the exam in 5 years given history of colon polyps on previous exams.  Reports of those exams are not available to us .  Following that colonoscopy she had hemorrhoid banding for treatment of internal hemorrhoids and she states that has helped her.  She has occasional swelling from this but does not bother her too much.  She has rare constipation, her bowels are pretty regular.  She is not aware of any first-degree family history of colon cancer, she thinks her grandmother may have had colon cancer.  She denies any cardiopulmonary symptoms currently.  She does have a history of SVT.  She has not required ablation.  She has a history of cholecystectomy in 2011 for right upper quadrant pain.  She states it went away but then came back.  She may have this once every 6 months or so, has not prandial in relationship and is very sporadic.  She otherwise follows with Dr. Shayne of internal medicine.  She states her lab work with him is up-to-date including LFTs.  She was told she was anemic and has been referred to hematology for that.  We do not have any results of those labs on file today     Colonoscopy 05/12/2018 - Dr. Luis - normal colon, ileum, moderate hemorrhoids - excellent prep - told to repeat in 5 years     Past Medical History:  Diagnosis Date   Hemorrhoids    History of PSVT (paroxysmal supraventricular tachycardia)    Osteoporosis    SVT (supraventricular tachycardia) (HCC) 03/17/2022  History of colon polyps   Past Surgical History:  Procedure Laterality Date   ANTERIOR AND POSTERIOR REPAIR WITH SACROSPINOUS FIXATION N/A 03/28/2015   Procedure: ANTERIOR AND POSTERIOR REPAIR ;  Surgeon: Charlie Croak, MD;   Location: [facility];  Service: Gynecology;  Laterality: N/A;   BILATERAL SALPINGECTOMY Bilateral 03/28/2015   Procedure: BILATERAL SALPINGECTOMY;  Surgeon: Charlie Croak, MD;  Location: [facility];  Service: Gynecology;  Laterality: Bilateral;   CHOLECYSTECTOMY  2011   COLONOSCOPY     2003 and 2014   DILATION AND CURETTAGE OF UTERUS     1994 after delivery    LAPAROSCOPIC ASSISTED VAGINAL HYSTERECTOMY N/A 03/28/2015   Procedure: LAPAROSCOPIC ASSISTED VAGINAL HYSTERECTOMY;  Surgeon: Charlie Croak, MD;  Location: [facility];  Service: Gynecology;  Laterality: N/A;   PUBOVAGINAL SLING N/A 03/28/2015   Procedure: ALYSE SINGLE INCISION SLING WITH CYSTO;  Surgeon: Charlie Croak, MD;  Location: [facility];  Service: Gynecology;  Laterality: N/A;   Family History  Problem Relation Age of Onset   Hypertension Mother    Dementia Mother    Osteoporosis Mother    Lymphoma Father    Cancer Father        oral   Cancer Maternal Grandmother        Liver or colon   Leukemia Maternal Grandfather    CAD Paternal Grandmother    Prostate cancer Paternal Grandfather    Social History   Tobacco Use   Smoking status: Never   Smokeless tobacco: Never  Vaping Use   Vaping status: Never Used  Substance Use Topics   Alcohol use: Yes    Comment: less than 1 per day  Drug use: No   Current Outpatient Medications  Medication Sig Dispense Refill   Calcium Carbonate-Vit D-Min (CALTRATE 600+D PLUS MINERALS) 600-800 MG-UNIT TABS Take 1 tablet by mouth daily.     estradiol  (CLIMARA  - DOSED IN MG/24 HR) 0.05 mg/24hr patch Place 0.05 mg onto the skin once a week.     Multiple Vitamin (MULTIVITAMIN WITH MINERALS) TABS tablet Take 1 tablet by mouth daily.     Sodium Sulfate-Mag Sulfate-KCl (SUTAB ) [phone] MG TABS Take 24 tablets by mouth once for 1 dose. 24 tablet 0   zolpidem (AMBIEN) 5 MG tablet Take 5 mg by mouth as needed (for flights).     No current facility-administered medications for this visit.   No  Known Allergies   Review of Systems: All systems reviewed and negative except where noted in HPI.   No labs on file  Physical Exam: BP 90/64 (BP Location: Left Arm, Patient Position: Sitting, Cuff Size: Normal)   Pulse 80   Ht 5' 8 (1.727 m)   Wt 128 lb 4 oz (58.2 kg)   LMP 03/15/2015 (Exact Date)   BMI 19.50 kg/m  Constitutional: Pleasant,well-developed, female in no acute distress. HEENT: Normocephalic and atraumatic. Conjunctivae are normal. No scleral icterus. Neck supple.  Cardiovascular: Normal rate, regular rhythm.  Pulmonary/chest: Effort normal and breath sounds normal.  Abdominal: Soft, nondistended, nontender. There are no masses palpable. Extremities: no edema Neurological: Alert and oriented to person place and time. Skin: Skin is warm and dry. No rashes noted. Psychiatric: Normal mood and affect. Behavior is normal.   ASSESSMENT: 58 y.o. female here for assessment of the following  1. History of colon polyps   2. History of cholecystectomy    History of colon polyps remotely.  Her last colonoscopy over 5 years ago in 2019 was normal.  Dr. Luis had recommended a 5-year follow-up exam for her prior history of polyps but did not have records of her prior exams.  We discussed if she wanted to proceed with colonoscopy based off his recommendations from his last exam.  Following discussion of risks and benefits of colonoscopy she wants to proceed.  If no polyps on her next exam, we would then extend her neck surveillance for 10 years.  Otherwise history of cholecystectomy with rare upper abdominal discomfort.  This does not seem too bothersome or frequent.  Will get results of her latest labs including LFTs to make sure normal.  Of note she is reportedly told anemic, will check to see if her iron studies have been done.  Sounds like they have been normal and she has been referred to hematology but will get records to clarify  Will await colonoscopy and she can  follow-up with me for any other issues that bother her in the interim.  PLAN: - schedule colonoscopy at the [facility] - obtain recent lab from Dr Shirly - review LFTs, CBC, iron studies  Marcey Naval, MD [facility]

## 2023-11-07 ENCOUNTER — Encounter: Payer: Self-pay | Admitting: Certified Registered Nurse Anesthetist

## 2023-11-07 ENCOUNTER — Encounter: Payer: Self-pay | Admitting: Gastroenterology

## 2023-11-08 ENCOUNTER — Other Ambulatory Visit: Payer: Self-pay

## 2023-11-08 MED ORDER — SUTAB 1479-225-188 MG PO TABS: 1.0000 | ORAL_TABLET | Freq: Once | ORAL | 0 refills | Status: AC

## 2023-11-08 NOTE — Telephone Encounter (Signed)
 See MyChart message to patient. Sutab sent to [facility]

## 2023-11-10 ENCOUNTER — Encounter: Payer: Self-pay | Admitting: Gastroenterology

## 2023-11-10 ENCOUNTER — Ambulatory Visit: Payer: BC Managed Care – PPO | Admitting: Gastroenterology

## 2023-11-10 VITALS — BP 96/60 | HR 68 | Temp 97.2°F | Resp 12 | Ht 68.0 in | Wt 128.4 lb

## 2023-11-10 DIAGNOSIS — Z1211 Encounter for screening for malignant neoplasm of colon: Secondary | ICD-10-CM | POA: Diagnosis not present

## 2023-11-10 DIAGNOSIS — Z860100 Personal history of colon polyps, unspecified: Secondary | ICD-10-CM

## 2023-11-10 DIAGNOSIS — K648 Other hemorrhoids: Secondary | ICD-10-CM | POA: Diagnosis not present

## 2023-11-10 DIAGNOSIS — Q439 Congenital malformation of intestine, unspecified: Secondary | ICD-10-CM

## 2023-11-10 MED ORDER — SODIUM CHLORIDE 0.9 % IV SOLN: 500.0000 mL | Freq: Once | INTRAVENOUS | Status: DC

## 2023-11-10 NOTE — Patient Instructions (Signed)
 Educational handout provided to patient related to Hemorrhoids  Resume previous diet  Continue present medications  REPEAT COLONOSCOPY IN 10 YEARS   YOU HAD AN ENDOSCOPIC PROCEDURE TODAY AT THE [facility]:   Refer to the procedure report that was given to you for any specific questions about what was found during the examination.  If the procedure report does not answer your questions, please call your gastroenterologist to clarify.  If you requested that your care partner not be given the details of your procedure findings, then the procedure report has been included in a sealed envelope for you to review at your convenience later.  YOU SHOULD EXPECT: Some feelings of bloating in the abdomen. Passage of more gas than usual.  Walking can help get rid of the air that was put into your GI tract during the procedure and reduce the bloating. If you had a lower endoscopy (such as a colonoscopy or flexible sigmoidoscopy) you may notice spotting of blood in your stool or on the toilet paper. If you underwent a bowel prep for your procedure, you may not have a normal bowel movement for a few days.  Please Note:  You might notice some irritation and congestion in your nose or some drainage.  This is from the oxygen used during your procedure.  There is no need for concern and it should clear up in a day or so.  SYMPTOMS TO REPORT IMMEDIATELY:  Following lower endoscopy (colonoscopy or flexible sigmoidoscopy):  Excessive amounts of blood in the stool  Significant tenderness or worsening of abdominal pains  Swelling of the abdomen that is new, acute  Fever of 100F or higher  For urgent or emergent issues, a gastroenterologist can be reached at any hour by calling [phone]. Do not use MyChart messaging for urgent concerns.    DIET:  We do recommend a small meal at first, but then you may proceed to your regular diet.  Drink plenty of fluids but you should avoid alcoholic beverages  for 24 hours.  ACTIVITY:  You should plan to take it easy for the rest of today and you should NOT DRIVE or use heavy machinery until tomorrow (because of the sedation medicines used during the test).    FOLLOW UP: Our staff will call the number listed on your records the next business day following your procedure.  We will call around 7:15- 8:00 am to check on you and address any questions or concerns that you may have regarding the information given to you following your procedure. If we do not reach you, we will leave a message.     If any biopsies were taken you will be contacted by phone or by letter within the next 1-3 weeks.  Please call us  at [phone] if you have not heard about the biopsies in 3 weeks.    SIGNATURES/CONFIDENTIALITY: You and/or your care partner have signed paperwork which will be entered into your electronic medical record.  These signatures attest to the fact that that the information above on your After Visit Summary has been reviewed and is understood.  Full responsibility of the confidentiality of this discharge information lies with you and/or your care-partner.

## 2023-11-10 NOTE — Op Note (Signed)
 [facility] Patient Name: Cindy Blanchard Procedure Date: 11/10/2023 10:19 AM MRN: [id number] Endoscopist: Landon Pinion P. General [place] , MD, [id number] Age: 58 Referring MD:  Date of Birth: July 11, 1966 Gender: Female Account #: [id number] Procedure:                Colonoscopy Indications:              High risk colon cancer surveillance: Personal                            history of colonic polyps remotely Medicines:                Monitored Anesthesia Care Procedure:                Pre-Anesthesia Assessment:                           - Prior to the procedure, a History and Physical                            was performed, and patient medications and                            allergies were reviewed. The patient's tolerance of                            previous anesthesia was also reviewed. The risks                            and benefits of the procedure and the sedation                            options and risks were discussed with the patient.                            All questions were answered, and informed consent                            was obtained. Prior Anticoagulants: The patient has                            taken no anticoagulant or antiplatelet agents. ASA                            Grade Assessment: I - A normal, healthy patient.                            After reviewing the risks and benefits, the patient                            was deemed in satisfactory condition to undergo the                            procedure.  After obtaining informed consent, the colonoscope                            was passed under direct vision. Throughout the                            procedure, the patient's blood pressure, pulse, and                            oxygen saturations were monitored continuously. The                            [id number]-HQ190L Colonoscope [id number] was introduced                            through the anus and advanced to  the the cecum,                            identified by appendiceal orifice and ileocecal                            valve. The colonoscopy was performed without                            difficulty. The patient tolerated the procedure                            well. The quality of the bowel preparation was                            good. The ileocecal valve, appendiceal orifice, and                            rectum were photographed. Scope In: 10:39:17 AM Scope Out: 11:01:54 AM Scope Withdrawal Time: 0 hours 13 minutes 47 seconds  Total Procedure Duration: 0 hours 22 minutes 37 seconds  Findings:                 The perianal and digital rectal examinations were                            normal.                           The colon was extremely tortuous.                           Internal hemorrhoids were found during retroflexion.                           The exam was otherwise without abnormality. Complications:            No immediate complications. Estimated blood loss:                            None. Estimated Blood Loss:  Estimated blood loss: none. Impression:               - Tortuous colon.                           - Internal hemorrhoids.                           - The examination was otherwise normal.                           - No polyps. Recommendation:           - Patient has a contact number available for                            emergencies. The signs and symptoms of potential                            delayed complications were discussed with the                            patient. Return to normal activities tomorrow.                            Written discharge instructions were provided to the                            patient.                           - Resume previous diet.                           - Continue present medications.                           - Repeat colonoscopy in 10 years for surveillance. Landon Pinion P. Ashika Apuzzo, MD 11/10/2023  11:06:09 AM This report has been signed electronically.

## 2023-11-10 NOTE — Progress Notes (Signed)
 History and Physical Interval Note: Seen on 11/02/23 - no interval changes. Prior colonoscopy 04/2018 - Dr. Andriette Keeling, history of polyps. Here for surveillance exam.  Otherwise feels well without complaints today.    11/10/2023 10:29 AM  Cindy Blanchard  has presented today for endoscopic procedure(s), with the diagnosis of  Encounter Diagnosis  Name Primary?   History of colon polyps Yes  .  The various methods of evaluation and treatment have been discussed with the patient and/or family. After consideration of risks, benefits and other options for treatment, the patient has consented to  the endoscopic procedure(s).   The patient's history has been reviewed, patient examined, no change in status, stable for surgery.  I have reviewed the patient's chart and labs.  Questions were answered to the patient's satisfaction.    Christi Coward, MD [facility]

## 2023-11-11 ENCOUNTER — Telehealth: Payer: Self-pay

## 2023-11-11 NOTE — Telephone Encounter (Signed)
  Follow up Call-     11/10/2023   10:14 AM  Call back number  Post procedure Call Back phone  # [phone]  Permission to leave phone message Yes     Patient questions:  Do you have a fever, pain , or abdominal swelling? No. Pain Score  0 *  Have you tolerated food without any problems? Yes.    Have you been able to return to your normal activities? Yes.    Do you have any questions about your discharge instructions: Diet   No. Medications  No. Follow up visit  No.  Do you have questions or concerns about your Care? No.  Actions: * If pain score is 4 or above: No action needed, pain <4.

## 2023-11-15 ENCOUNTER — Telehealth: Payer: Self-pay | Admitting: Gastroenterology

## 2023-11-15 NOTE — Telephone Encounter (Signed)
Labs arrived from Dr. Laurey Morale office:  07/06/23: - Hgb 11.5, MCV 91, plt 196, WBC 6.88 - labs in July showed normal iron studies, B12, and SPEP  He is monitoring anemia and may refer to hematology pending her course. No evidence of IDA.

## 2024-05-31 DIAGNOSIS — Z681 Body mass index (BMI) 19 or less, adult: Secondary | ICD-10-CM | POA: Diagnosis not present

## 2024-05-31 DIAGNOSIS — Z01419 Encounter for gynecological examination (general) (routine) without abnormal findings: Secondary | ICD-10-CM | POA: Diagnosis not present

## 2024-05-31 DIAGNOSIS — Z1231 Encounter for screening mammogram for malignant neoplasm of breast: Secondary | ICD-10-CM | POA: Diagnosis not present

## 2024-08-17 DIAGNOSIS — Z0189 Encounter for other specified special examinations: Secondary | ICD-10-CM | POA: Diagnosis not present

## 2024-08-17 DIAGNOSIS — M810 Age-related osteoporosis without current pathological fracture: Secondary | ICD-10-CM | POA: Diagnosis not present

## 2024-08-17 DIAGNOSIS — K219 Gastro-esophageal reflux disease without esophagitis: Secondary | ICD-10-CM | POA: Diagnosis not present

## 2024-08-17 DIAGNOSIS — Z1212 Encounter for screening for malignant neoplasm of rectum: Secondary | ICD-10-CM | POA: Diagnosis not present

## 2024-08-24 DIAGNOSIS — I471 Supraventricular tachycardia, unspecified: Secondary | ICD-10-CM | POA: Diagnosis not present

## 2024-08-24 DIAGNOSIS — Z1331 Encounter for screening for depression: Secondary | ICD-10-CM | POA: Diagnosis not present

## 2024-08-24 DIAGNOSIS — Z0000 Encounter for general adult medical examination without abnormal findings: Secondary | ICD-10-CM | POA: Diagnosis not present

## 2024-08-24 DIAGNOSIS — R82998 Other abnormal findings in urine: Secondary | ICD-10-CM | POA: Diagnosis not present
